# Patient Record
Sex: Female | Born: 1965 | Race: White | Hispanic: No | Marital: Married | Smoking: Current every day smoker
Health system: Southern US, Community
[De-identification: ages and names within clinical notes are randomized; demographics above are authoritative.]

## PROBLEM LIST (undated history)

## (undated) DIAGNOSIS — S82142A Displaced bicondylar fracture of left tibia, initial encounter for closed fracture: Secondary | ICD-10-CM

## (undated) DIAGNOSIS — R519 Headache, unspecified: Secondary | ICD-10-CM

## (undated) DIAGNOSIS — F17200 Nicotine dependence, unspecified, uncomplicated: Secondary | ICD-10-CM

## (undated) DIAGNOSIS — K219 Gastro-esophageal reflux disease without esophagitis: Secondary | ICD-10-CM

## (undated) DIAGNOSIS — F419 Anxiety disorder, unspecified: Secondary | ICD-10-CM

## (undated) DIAGNOSIS — R51 Headache: Secondary | ICD-10-CM

## (undated) HISTORY — PX: JOINT REPLACEMENT: SHX530

## (undated) HISTORY — PX: NO PAST SURGERIES: SHX2092

---

## 2009-03-29 ENCOUNTER — Encounter: Admission: RE | Admit: 2009-03-29 | Discharge: 2009-03-29 | Payer: Self-pay | Admitting: Unknown Physician Specialty

## 2009-10-16 ENCOUNTER — Encounter: Admission: RE | Admit: 2009-10-16 | Discharge: 2009-10-16 | Payer: Self-pay | Admitting: Unknown Physician Specialty

## 2011-10-21 ENCOUNTER — Encounter (HOSPITAL_BASED_OUTPATIENT_CLINIC_OR_DEPARTMENT_OTHER): Payer: Self-pay

## 2011-10-21 ENCOUNTER — Ambulatory Visit (HOSPITAL_BASED_OUTPATIENT_CLINIC_OR_DEPARTMENT_OTHER)
Admission: RE | Admit: 2011-10-21 | Discharge: 2011-10-21 | Disposition: A | Discharge status: Home / Self Care | Payer: BC Managed Care – PPO | Source: Ambulatory Visit | Attending: Unknown Physician Specialty | Admitting: Unknown Physician Specialty

## 2011-10-21 ENCOUNTER — Other Ambulatory Visit (HOSPITAL_BASED_OUTPATIENT_CLINIC_OR_DEPARTMENT_OTHER): Payer: Self-pay | Admitting: Unknown Physician Specialty

## 2011-10-21 ENCOUNTER — Ambulatory Visit (INDEPENDENT_AMBULATORY_CARE_PROVIDER_SITE_OTHER): Payer: BC Managed Care – PPO

## 2011-10-21 ENCOUNTER — Other Ambulatory Visit: Payer: Self-pay | Admitting: Unknown Physician Specialty

## 2011-10-21 DIAGNOSIS — R109 Unspecified abdominal pain: Secondary | ICD-10-CM

## 2011-10-21 DIAGNOSIS — K5730 Diverticulosis of large intestine without perforation or abscess without bleeding: Secondary | ICD-10-CM | POA: Insufficient documentation

## 2011-10-21 MED ORDER — IOHEXOL 300 MG/ML  SOLN
100.0000 mL | Freq: Once | INTRAMUSCULAR | Status: AC | PRN
  Administered 2011-10-21: 100 mL via INTRAVENOUS

## 2013-09-17 ENCOUNTER — Ambulatory Visit (INDEPENDENT_AMBULATORY_CARE_PROVIDER_SITE_OTHER): Payer: BC Managed Care – PPO

## 2013-09-17 ENCOUNTER — Other Ambulatory Visit: Payer: Self-pay | Admitting: Family Medicine

## 2013-09-17 DIAGNOSIS — R1084 Generalized abdominal pain: Secondary | ICD-10-CM

## 2014-04-22 ENCOUNTER — Encounter (HOSPITAL_BASED_OUTPATIENT_CLINIC_OR_DEPARTMENT_OTHER): Payer: Self-pay | Admitting: Emergency Medicine

## 2014-04-22 ENCOUNTER — Emergency Department (HOSPITAL_BASED_OUTPATIENT_CLINIC_OR_DEPARTMENT_OTHER): Payer: BC Managed Care – PPO

## 2014-04-22 ENCOUNTER — Emergency Department (HOSPITAL_BASED_OUTPATIENT_CLINIC_OR_DEPARTMENT_OTHER)
Admission: EM | Admit: 2014-04-22 | Discharge: 2014-04-22 | Disposition: A | Discharge status: Home / Self Care | Payer: BC Managed Care – PPO | Attending: Emergency Medicine | Admitting: Emergency Medicine

## 2014-04-22 DIAGNOSIS — Z720 Tobacco use: Secondary | ICD-10-CM | POA: Diagnosis not present

## 2014-04-22 DIAGNOSIS — S20212A Contusion of left front wall of thorax, initial encounter: Secondary | ICD-10-CM | POA: Insufficient documentation

## 2014-04-22 DIAGNOSIS — W01198A Fall on same level from slipping, tripping and stumbling with subsequent striking against other object, initial encounter: Secondary | ICD-10-CM | POA: Insufficient documentation

## 2014-04-22 DIAGNOSIS — Y9289 Other specified places as the place of occurrence of the external cause: Secondary | ICD-10-CM | POA: Insufficient documentation

## 2014-04-22 DIAGNOSIS — Y998 Other external cause status: Secondary | ICD-10-CM | POA: Diagnosis not present

## 2014-04-22 DIAGNOSIS — S299XXA Unspecified injury of thorax, initial encounter: Secondary | ICD-10-CM | POA: Diagnosis present

## 2014-04-22 DIAGNOSIS — Y9389 Activity, other specified: Secondary | ICD-10-CM | POA: Diagnosis not present

## 2014-04-22 LAB — URINE MICROSCOPIC-ADD ON

## 2014-04-22 LAB — URINALYSIS, ROUTINE W REFLEX MICROSCOPIC
BILIRUBIN URINE: NEGATIVE
Glucose, UA: NEGATIVE mg/dL
Ketones, ur: NEGATIVE mg/dL
LEUKOCYTES UA: NEGATIVE
NITRITE: NEGATIVE
PH: 5 (ref 5.0–8.0)
Protein, ur: NEGATIVE mg/dL
SPECIFIC GRAVITY, URINE: 1.005 (ref 1.005–1.030)
Urobilinogen, UA: 0.2 mg/dL (ref 0.0–1.0)

## 2014-04-22 MED ORDER — IBUPROFEN 600 MG PO TABS: 600.0000 mg | ORAL_TABLET | Freq: Four times a day (QID) | ORAL | Status: DC | PRN

## 2014-04-22 MED ORDER — OXYCODONE-ACETAMINOPHEN 5-325 MG PO TABS
1.0000 | ORAL_TABLET | Freq: Once | ORAL | Status: DC
  Filled 2014-04-22: qty 1

## 2014-04-22 MED ORDER — OXYCODONE-ACETAMINOPHEN 5-325 MG PO TABS: 1.0000 | ORAL_TABLET | Freq: Four times a day (QID) | ORAL | Status: DC | PRN

## 2014-04-22 MED ORDER — KETOROLAC TROMETHAMINE 60 MG/2ML IM SOLN
60.0000 mg | Freq: Once | INTRAMUSCULAR | Status: AC
  Administered 2014-04-22: 60 mg via INTRAMUSCULAR
  Filled 2014-04-22: qty 2

## 2014-04-22 NOTE — ED Notes (Signed)
Fall hitting left side rib area on metal bar stool. Bruising noted.

## 2014-04-22 NOTE — Discharge Instructions (Signed)

## 2014-04-22 NOTE — ED Provider Notes (Signed)
CSN: [id number]     Arrival date & time 04/22/14  0745 History   First MD Initiated Contact with Patient 04/22/14 [phone]     Chief Complaint  Patient presents with  . Rib Injury     (Consider location/radiation/quality/duration/timing/severity/associated sxs/prior Treatment) HPI  This a 49 year old female who presents following a fall. Patient reports left-sided chest pain following a fall on Wednesday. She states that she tripped and fell over her cat and fell onto a bar stool at home. She's had progressive pain. Current pain is 10 out of 10. It is not relieved with Motrin at home. It is worse with breathing and coughing. Patient denies any abdominal pain, nausea, vomiting, diarrhea, hematuria. Denies any other injury. Denies hitting her head or loss of consciousness. Patient is not on any anticoagulants.  History reviewed. No pertinent past medical history. History reviewed. No pertinent past surgical history. No family history on file. History  Substance Use Topics  . Smoking status: Current Every Day Smoker -- 0.50 packs/day    Types: Cigarettes  . Smokeless tobacco: Not on file  . Alcohol Use: No   OB History    No data available     Review of Systems  Constitutional: Negative for fever.  Respiratory: Negative for cough, chest tightness and shortness of breath.   Cardiovascular: Positive for chest pain.  Gastrointestinal: Negative for nausea, vomiting and abdominal pain.  Genitourinary: Negative for hematuria.  Musculoskeletal: Negative for back pain.  Skin: Positive for wound.  Neurological: Negative for syncope and headaches.  Psychiatric/Behavioral: Negative for confusion.  All other systems reviewed and are negative.     Allergies  Review of patient's allergies indicates no known allergies.  Home Medications   Prior to Admission medications   Medication Sig Start Date End Date Taking? Authorizing Provider  ibuprofen (ADVIL,MOTRIN) 800 MG tablet Take 800 mg by  mouth every 8 (eight) hours as needed.   Yes Historical Provider, MD  ibuprofen (ADVIL,MOTRIN) 600 MG tablet Take 1 tablet (600 mg total) by mouth every 6 (six) hours as needed. 04/22/14   Shon Batonourtney F Mohamedamin Nifong, MD  oxyCODONE-acetaminophen (PERCOCET/ROXICET) 5-325 MG per tablet Take 1-2 tablets by mouth every 6 (six) hours as needed for severe pain. 04/22/14   Shon Batonourtney F Bethene Hankinson, MD   BP 128/55 mmHg  Pulse 89  Temp(Src) 98.4 F (36.9 C) (Oral)  Resp 16  Ht 5' 8.5" (1.74 m)  Wt 155 lb (70.308 kg)  BMI 23.22 kg/m2  SpO2 97% Physical Exam  Constitutional: She is oriented to person, place, and time. She appears well-developed and well-nourished. No distress.  HENT:  Head: Normocephalic and atraumatic.  Eyes: Pupils are equal, round, and reactive to light.  Cardiovascular: Normal rate, regular rhythm and normal heart sounds.   No murmur heard. Pulmonary/Chest: Effort normal and breath sounds normal. No respiratory distress. She has no wheezes. She exhibits tenderness.  Tenderness to palpation over the left lateral chest wall, no crepitus noted, bruising noted over the left flank  Abdominal: Soft. Bowel sounds are normal. There is no tenderness. There is no rebound.  Neurological: She is alert and oriented to person, place, and time.  Skin: Skin is warm and dry.  Bruising of her left flank  Psychiatric: She has a normal mood and affect.  Nursing note and vitals reviewed.   ED Course  Procedures (including critical care time) Labs Review Labs Reviewed  URINALYSIS, ROUTINE W REFLEX MICROSCOPIC - Abnormal; Notable for the following:    Hgb urine dipstick  TRACE (*)    All other components within normal limits  URINE MICROSCOPIC-ADD ON - Abnormal; Notable for the following:    Squamous Epithelial / LPF FEW (*)    Bacteria, UA FEW (*)    All other components within normal limits    Imaging Review Dg Chest 2 View  04/22/2014   CLINICAL DATA:  Status post fall 2 days ago with a blow to the left  side of the chest. Left rib pain.  EXAM: CHEST  2 VIEW  COMPARISON:  None.  FINDINGS: The lungs are clear. The chest is hyperexpanded with attenuation of the pulmonary vasculature. Heart size is normal. No pneumothorax or pleural effusion.  IMPRESSION: No acute disease.  Findings compatible with emphysema.   Electronically Signed   By: Drusilla Kanner M.D.   On: 04/22/2014 08:52     EKG Interpretation None      MDM   Final diagnoses:  Rib contusion, left, initial encounter    Patient presents with injury to the left rib cage, no crepitus. Vital signs stable. No evidence of refracture on exam. Patient is a smoker. Advised cessation. Patient will be given pain control for likely rib contusion versus occult rib fracture. Discussed with patient the diagnosis and supportive care at home. Patient stated understanding.  After history, exam, and medical workup I feel the patient has been appropriately medically screened and is safe for discharge home. Pertinent diagnoses were discussed with the patient. Patient was given return precautions.   Shon Baton, MD 04/22/14 1352

## 2017-05-01 ENCOUNTER — Other Ambulatory Visit: Payer: Self-pay

## 2017-05-01 ENCOUNTER — Encounter (HOSPITAL_BASED_OUTPATIENT_CLINIC_OR_DEPARTMENT_OTHER): Payer: Self-pay | Admitting: *Deleted

## 2017-05-01 ENCOUNTER — Inpatient Hospital Stay (HOSPITAL_BASED_OUTPATIENT_CLINIC_OR_DEPARTMENT_OTHER)
Admission: EM | Admit: 2017-05-01 | Discharge: 2017-05-07 | DRG: 488 | Disposition: A | Discharge status: Home Health | Payer: Worker's Compensation | Attending: Student | Admitting: Student

## 2017-05-01 ENCOUNTER — Emergency Department (HOSPITAL_BASED_OUTPATIENT_CLINIC_OR_DEPARTMENT_OTHER): Payer: Worker's Compensation

## 2017-05-01 DIAGNOSIS — K219 Gastro-esophageal reflux disease without esophagitis: Secondary | ICD-10-CM | POA: Diagnosis present

## 2017-05-01 DIAGNOSIS — S82142A Displaced bicondylar fracture of left tibia, initial encounter for closed fracture: Secondary | ICD-10-CM | POA: Diagnosis present

## 2017-05-01 DIAGNOSIS — Y92212 Middle school as the place of occurrence of the external cause: Secondary | ICD-10-CM | POA: Diagnosis not present

## 2017-05-01 DIAGNOSIS — T148XXA Other injury of unspecified body region, initial encounter: Secondary | ICD-10-CM

## 2017-05-01 DIAGNOSIS — S83282A Other tear of lateral meniscus, current injury, left knee, initial encounter: Secondary | ICD-10-CM | POA: Diagnosis present

## 2017-05-01 DIAGNOSIS — W208XXA Other cause of strike by thrown, projected or falling object, initial encounter: Secondary | ICD-10-CM | POA: Diagnosis not present

## 2017-05-01 DIAGNOSIS — D62 Acute posthemorrhagic anemia: Secondary | ICD-10-CM | POA: Diagnosis not present

## 2017-05-01 DIAGNOSIS — F17200 Nicotine dependence, unspecified, uncomplicated: Secondary | ICD-10-CM | POA: Diagnosis present

## 2017-05-01 DIAGNOSIS — Y990 Civilian activity done for income or pay: Secondary | ICD-10-CM | POA: Diagnosis not present

## 2017-05-01 DIAGNOSIS — F17210 Nicotine dependence, cigarettes, uncomplicated: Secondary | ICD-10-CM | POA: Diagnosis present

## 2017-05-01 DIAGNOSIS — M79662 Pain in left lower leg: Secondary | ICD-10-CM | POA: Diagnosis present

## 2017-05-01 DIAGNOSIS — S82122A Displaced fracture of lateral condyle of left tibia, initial encounter for closed fracture: Secondary | ICD-10-CM | POA: Diagnosis present

## 2017-05-01 DIAGNOSIS — Z01811 Encounter for preprocedural respiratory examination: Secondary | ICD-10-CM

## 2017-05-01 DIAGNOSIS — F419 Anxiety disorder, unspecified: Secondary | ICD-10-CM | POA: Diagnosis present

## 2017-05-01 HISTORY — DX: Gastro-esophageal reflux disease without esophagitis: K21.9

## 2017-05-01 HISTORY — DX: Displaced bicondylar fracture of left tibia, initial encounter for closed fracture: S82.142A

## 2017-05-01 HISTORY — DX: Nicotine dependence, unspecified, uncomplicated: F17.200

## 2017-05-01 HISTORY — DX: Anxiety disorder, unspecified: F41.9

## 2017-05-01 LAB — CBC
HEMATOCRIT: 36.5 % (ref 36.0–46.0)
HEMOGLOBIN: 12.2 g/dL (ref 12.0–15.0)
MCH: 30.9 pg (ref 26.0–34.0)
MCHC: 33.4 g/dL (ref 30.0–36.0)
MCV: 92.4 fL (ref 78.0–100.0)
Platelets: 278 10*3/uL (ref 150–400)
RBC: 3.95 MIL/uL (ref 3.87–5.11)
RDW: 12.7 % (ref 11.5–15.5)
WBC: 10.7 10*3/uL — ABNORMAL HIGH (ref 4.0–10.5)

## 2017-05-01 LAB — CREATININE, SERUM
CREATININE: 0.77 mg/dL (ref 0.44–1.00)
GFR calc Af Amer: >60 mL/min (ref >60)

## 2017-05-01 LAB — BASIC METABOLIC PANEL
Anion gap: 12 (ref 5–15)
BUN: 19 mg/dL (ref 6–20)
CHLORIDE: 101 mmol/L (ref 101–111)
CO2: 21 mmol/L — ABNORMAL LOW (ref 22–32)
CREATININE: 0.69 mg/dL (ref 0.44–1.00)
Calcium: 9.2 mg/dL (ref 8.9–10.3)
GFR calc Af Amer: >60 mL/min (ref >60)
GFR calc non Af Amer: >60 mL/min (ref >60)
Glucose, Bld: 117 mg/dL — ABNORMAL HIGH (ref 65–99)
Potassium: 3.9 mmol/L (ref 3.5–5.1)
SODIUM: 134 mmol/L — AB (ref 135–145)

## 2017-05-01 LAB — CBC WITH DIFFERENTIAL/PLATELET
Basophils Absolute: 0 10*3/uL (ref 0.0–0.1)
Basophils Relative: 0 %
EOS ABS: 0 10*3/uL (ref 0.0–0.7)
EOS PCT: 0 %
HCT: 36.4 % (ref 36.0–46.0)
HEMOGLOBIN: 12.7 g/dL (ref 12.0–15.0)
LYMPHS ABS: 1 10*3/uL (ref 0.7–4.0)
Lymphocytes Relative: 9 %
MCH: 31.6 pg (ref 26.0–34.0)
MCHC: 34.9 g/dL (ref 30.0–36.0)
MCV: 90.5 fL (ref 78.0–100.0)
MONO ABS: 0.5 10*3/uL (ref 0.1–1.0)
MONOS PCT: 4 %
NEUTROS PCT: 87 %
Neutro Abs: 9.8 10*3/uL — ABNORMAL HIGH (ref 1.7–7.7)
Platelets: 307 10*3/uL (ref 150–400)
RBC: 4.02 MIL/uL (ref 3.87–5.11)
RDW: 12.1 % (ref 11.5–15.5)
WBC: 11.4 10*3/uL — ABNORMAL HIGH (ref 4.0–10.5)

## 2017-05-01 MED ORDER — OXYCODONE HCL 5 MG PO TABS
5.0000 mg | ORAL_TABLET | Freq: Once | ORAL | Status: AC
  Administered 2017-05-01: 5 mg via ORAL
  Filled 2017-05-01: qty 1

## 2017-05-01 MED ORDER — ACETAMINOPHEN 500 MG PO TABS
1000.0000 mg | ORAL_TABLET | Freq: Once | ORAL | Status: AC
  Administered 2017-05-01: 1000 mg via ORAL
  Filled 2017-05-01: qty 2

## 2017-05-01 MED ORDER — HYDROMORPHONE HCL 1 MG/ML IJ SOLN
1.0000 mg | Freq: Once | INTRAMUSCULAR | Status: AC
  Administered 2017-05-01: 1 mg via INTRAVENOUS
  Filled 2017-05-01: qty 1

## 2017-05-01 MED ORDER — KETAMINE HCL 10 MG/ML IJ SOLN
10.0000 mg | Freq: Once | INTRAMUSCULAR | Status: AC
  Administered 2017-05-01: 10 mg via INTRAVENOUS
  Filled 2017-05-01: qty 1

## 2017-05-01 MED ORDER — DIAZEPAM 5 MG PO TABS
5.0000 mg | ORAL_TABLET | Freq: Once | ORAL | Status: AC
  Administered 2017-05-01: 5 mg via ORAL
  Filled 2017-05-01: qty 1

## 2017-05-01 MED ORDER — ONDANSETRON HCL 4 MG/2ML IJ SOLN
4.0000 mg | Freq: Once | INTRAMUSCULAR | Status: AC
  Administered 2017-05-01: 4 mg via INTRAVENOUS
  Filled 2017-05-01: qty 2

## 2017-05-01 MED ORDER — ENOXAPARIN SODIUM 40 MG/0.4ML ~~LOC~~ SOLN
40.0000 mg | SUBCUTANEOUS | Status: DC
  Administered 2017-05-02 – 2017-05-03 (×3): 40 mg via SUBCUTANEOUS
  Filled 2017-05-01 (×3): qty 0.4

## 2017-05-01 MED ORDER — SODIUM CHLORIDE 0.9 % IV SOLN
INTRAVENOUS | Status: DC
  Administered 2017-05-02 – 2017-05-03 (×3): via INTRAVENOUS

## 2017-05-01 MED ORDER — OXYCODONE HCL 5 MG PO TABS
5.0000 mg | ORAL_TABLET | ORAL | Status: DC | PRN
Start: 2017-05-01 — End: 2017-05-02
  Administered 2017-05-02: 10 mg via ORAL
  Filled 2017-05-01: qty 2

## 2017-05-01 MED ORDER — MORPHINE SULFATE (PF) 2 MG/ML IV SOLN
1.0000 mg | INTRAVENOUS | Status: DC | PRN
Start: 2017-05-01 — End: 2017-05-07
  Administered 2017-05-02 – 2017-05-07 (×15): 2 mg via INTRAVENOUS
  Filled 2017-05-01 (×16): qty 1

## 2017-05-01 MED ORDER — KETOROLAC TROMETHAMINE 15 MG/ML IJ SOLN
15.0000 mg | Freq: Once | INTRAMUSCULAR | Status: AC
Start: 2017-05-01 — End: 2017-05-01
  Administered 2017-05-01: 15 mg via INTRAMUSCULAR
  Filled 2017-05-01: qty 1

## 2017-05-01 MED ORDER — ACETAMINOPHEN 325 MG PO TABS
650.0000 mg | ORAL_TABLET | Freq: Four times a day (QID) | ORAL | Status: DC | PRN
  Administered 2017-05-01: 650 mg via ORAL
  Filled 2017-05-01: qty 2

## 2017-05-01 MED ORDER — SERTRALINE HCL 100 MG PO TABS
100.0000 mg | ORAL_TABLET | Freq: Every day | ORAL | Status: DC
  Administered 2017-05-02 – 2017-05-07 (×6): 100 mg via ORAL
  Filled 2017-05-01 (×6): qty 1

## 2017-05-01 MED ORDER — METHOCARBAMOL 1000 MG/10ML IJ SOLN
500.0000 mg | Freq: Four times a day (QID) | INTRAVENOUS | Status: DC | PRN
  Administered 2017-05-02: 500 mg via INTRAVENOUS
  Filled 2017-05-01 (×2): qty 5

## 2017-05-01 MED ORDER — ACETAMINOPHEN 650 MG RE SUPP: 650.0000 mg | Freq: Four times a day (QID) | RECTAL | Status: DC | PRN

## 2017-05-01 MED ORDER — METHOCARBAMOL 500 MG PO TABS
500.0000 mg | ORAL_TABLET | Freq: Four times a day (QID) | ORAL | Status: DC | PRN
  Administered 2017-05-02: 500 mg via ORAL
  Filled 2017-05-01: qty 1

## 2017-05-01 NOTE — H&P (Addendum)
ORTHOPAEDIC H&P  REQUESTING PHYSICIAN: Samson FredericSwinteck, Eydie Wormley, MD  PCP:  Patient, No Pcp Per  Chief Complaint: L knee pain  HPI: Amber Haley is a 52 y.o. female who complains of  L knee pain after a pallet of frozen juice fell on her left leg. 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. Denies other injuries. Workup revealed a comminuted displaced left lateral tibial plateau fracture. She is currently quitting smoking and is down to 5 cigarettes per day. Denies numbness/tingling.  Past Medical History:  Diagnosis Date  . Anxiety    History reviewed. No pertinent surgical history. Social History   Socioeconomic History  . Marital status: Married    Spouse name: Not on file  . Number of children: Not on file  . Years of education: Not on file  . Highest education level: Not on file  Occupational History  . Not on file  Social Needs  . Financial resource strain: Not on file  . Food insecurity:    Worry: Not on file    Inability: Not on file  . Transportation needs:    Medical: Not on file    Non-medical: Not on file  Tobacco Use  . Smoking status: Current Every Day Smoker    Packs/day: 0.50    Types: Cigarettes  . Smokeless tobacco: Never Used  Substance and Sexual Activity  . Alcohol use: No  . Drug use: Not on file  . Sexual activity: Not on file  Lifestyle  . Physical activity:    Days per week: Not on file    Minutes per session: Not on file  . Stress: Not on file  Relationships  . Social connections:    Talks on phone: Not on file    Gets together: Not on file    Attends religious service: Not on file    Active member of club or organization: Not on file    Attends meetings of clubs or organizations: Not on file    Relationship status: Not on file  Other Topics Concern  . Not on file  Social History Narrative  . Not on file   No family history on file. No Known Allergies Prior to Admission medications   Medication Sig Start Date End Date Taking?  Authorizing Provider  ibuprofen (ADVIL,MOTRIN) 600 MG tablet Take 1 tablet (600 mg total) by mouth every 6 (six) hours as needed. 04/22/14   Horton, Mayer Maskerourtney F, MD  ibuprofen (ADVIL,MOTRIN) 800 MG tablet Take 800 mg by mouth every 8 (eight) hours as needed.    [provider]  oxyCODONE-acetaminophen (PERCOCET/ROXICET) 5-325 MG per tablet Take 1-2 tablets by mouth every 6 (six) hours as needed for severe pain. 04/22/14   Horton, Mayer Maskerourtney F, MD  Sertraline HCl (ZOLOFT PO) Take by mouth.    [provider]   Dg Tibia/fibula Left  Result Date: 05/01/2017 CLINICAL DATA:  Left lower leg pain after a case of juices fell on her leg at work. EXAM: LEFT TIBIA AND FIBULA - 2 VIEW COMPARISON:  None. FINDINGS: Comminuted, depressed intra-articular fracture of the lateral tibial plateau. A longitudinal nondisplaced component extends inferiorly into the proximal tibial diaphysis. The ankle is grossly unremarkable. Bone mineralization is normal. Small phleboliths in the anterior lower leg IMPRESSION: 1. Comminuted, depressed intra-articular fracture of the lateral tibial plateau. Electronically Signed   By: Obie DredgeWilliam T Derry M.D.   On: 05/01/2017 14:46   Ct Knee Left Wo Contrast  Result Date: 05/01/2017 CLINICAL DATA:  Left knee pain.  Unable to straighten the knee. EXAM: CT OF THE LEFT KNEE WITHOUT CONTRAST TECHNIQUE: Multidetector CT imaging of the LEFT knee was performed according to the standard protocol. Multiplanar CT image reconstructions were also generated. COMPARISON:  None. FINDINGS: Bones/Joint/Cartilage Severely comminuted fracture of the lateral tibial plateau with 9 mm of depression involving an articular surface area of 30 x 20 mm. Subtle nondisplaced fracture cleft extends along the lateral margin of the tibia into the proximal tibial diaphysis. No other fracture or dislocation.  Small joint effusion. No aggressive osseous lesion. Ligaments Suboptimally assessed by CT. Muscles and Tendons  Muscles are normal. No muscle atrophy. Quadriceps and patellar tendon are intact. Soft tissues No focal fluid collection or hematoma. Soft tissue contusion along the anterior aspect of the proximal tibia. IMPRESSION: 1. Severely comminuted fracture of the lateral tibial plateau with 9 mm of depression involving an articular surface area of 30 x 20 mm. Subtle nondisplaced fracture cleft extends along the lateral margin of the tibia into the proximal tibial diaphysis. Electronically Signed   By: Elige Ko   On: 05/01/2017 16:12    Positive ROS: All other systems have been reviewed and were otherwise negative with the exception of those mentioned in the HPI and as above.  Physical Exam: General: Alert, no acute distress Cardiovascular: No pedal edema Respiratory: No cyanosis, no use of accessory musculature GI: No organomegaly, abdomen is soft and non-tender Skin: No lesions in the area of chief complaint Neurologic: Sensation intact distally Psychiatric: Patient is competent for consent with normal mood and affect Lymphatic: No axillary or cervical lymphadenopathy  MUSCULOSKELETAL: examination of the left lower extremity reveals no skin wounds. She has swelling and ecchymosis to the proximal lateral tibia. Compartments are soft and compressible. (+) TA/GS/EHL. SILT S/S/SP/DP/PT. 2+ DP.  Assessment: Comminuted L lateral split/depression tibial plateau fx with significant joint depression Tobacco abuse  Plan: Admit to hospital for compartment checks and aggressive elevation / ice. NWB LLE. Apply bulky jones dressing and knee immobilizer. Due to the complexity of this fracture, I have discussed her with the orthopaedic trauma service who will take over on Monday. OK for diet; NPO after MN on Sunday night for possible surgery on Monday.    Jonette Pesa, MD Cell [phone]    05/01/2017 6:12 PM ons

## 2017-05-01 NOTE — ED Notes (Signed)
Paged Orthopedic Surgeon @ [phone] (Dr. Linna CapriceSwinteck) @ 2:54 pm

## 2017-05-01 NOTE — Progress Notes (Signed)
Orthopedic Tech Progress Note Patient Details:  Wilmer FloorLisa A Tagliaferri 07/20/1965 [id number]  Ortho Devices Type of Ortho Device: Ace wrap, Lenora BoysWatson Jones splint, Knee Immobilizer Ortho Device/Splint Location: LLE Ortho Device/Splint Interventions: Ordered, Application   Post Interventions Patient Tolerated: Well Instructions Provided: Care of device   Jennye MoccasinHughes, Altagracia Rone Craig 05/01/2017, 6:16 PM

## 2017-05-01 NOTE — ED Notes (Signed)
[facility] arrived report given to AvnetPaul RN.

## 2017-05-01 NOTE — ED Notes (Signed)
Ortho tech at bedside 

## 2017-05-01 NOTE — ED Provider Notes (Signed)
x [facility] Provider Note   CSN: [id number] Arrival date & time: 05/01/17  1325     History   Chief Complaint Chief Complaint  Patient presents with  . Leg Injury    HPI Edit Amber Haley is a 52 y.o. female.  52 yo F with a chief complaint of left knee pain.  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  She was not pinned underneath.  Had pain and was unable to straighten her leg afterwards and so came to the ED.  She denies other injury.  The history is provided by the patient.  Injury  This is a new problem. The current episode started less than 1 hour ago. The problem occurs constantly. The problem has not changed since onset.Pertinent negatives include no chest pain, no headaches and no shortness of breath. The symptoms are aggravated by bending and twisting. Nothing relieves the symptoms. She has tried nothing for the symptoms. The treatment provided no relief.    Past Medical History:  Diagnosis Date  . Anxiety   . GERD (gastroesophageal reflux disease)   . Tibial plateau fracture, left 05/01/2017   severely comminuted left tibial plateau fracture occurring today when a stack of cases of orange juice fell onto her leg./notes 05/02/2017    Patient Active Problem List   Diagnosis Date Noted  . Closed fracture of lateral portion of left tibial plateau 05/01/2017    Past Surgical History:  Procedure Laterality Date  . NO PAST SURGERIES       OB History   None      Home Medications    Prior to Admission medications   Medication Sig Start Date End Date Taking? Authorizing Provider  ALPRAZolam Prudy Feeler) 0.25 MG tablet Take 0.25 mg by mouth daily as needed for anxiety.  04/15/16  Yes [provider]  ibuprofen (ADVIL,MOTRIN) 600 MG tablet Take 1 tablet (600 mg total) by mouth every 6 (six) hours as needed. Patient taking differently: Take 600 mg by mouth every 6 (six) hours as  needed (for pain).  04/22/14  Yes Horton, Mayer Masker, MD  Multiple Vitamins-Calcium (ONE-A-DAY WOMENS PO) Take 1 tablet by mouth daily.   Yes [provider]  omeprazole (PRILOSEC OTC) 20 MG tablet Take 20 mg by mouth daily.   Yes [provider]  Probiotic Product (ALIGN) 4 MG CAPS Take 4 mg by mouth daily.   Yes [provider]  sertraline (ZOLOFT) 100 MG tablet Take 100 mg by mouth daily.   Yes [provider]  traZODone (DESYREL) 50 MG tablet Take 50 mg by mouth at bedtime as needed for sleep.  11/02/15  Yes [provider]  oxyCODONE-acetaminophen (PERCOCET/ROXICET) 5-325 MG per tablet Take 1-2 tablets by mouth every 6 (six) hours as needed for severe pain. 04/22/14   Horton, Mayer Masker, MD    Family History History reviewed. No pertinent family history.  Social History Social History   Tobacco Use  . Smoking status: Current Every Day Smoker    Packs/day: 0.12    Years: 39.00    Pack years: 4.68    Types: Cigarettes  . Smokeless tobacco: Never Used  Substance Use Topics  . Alcohol use: Yes    Alcohol/week: 6.0 oz    Types: 10 Cans of beer per week  . Drug use: Not Currently     Allergies   Nsaids   Review of  Systems Review of Systems  Constitutional: Negative for chills and fever.  HENT: Negative for congestion and rhinorrhea.   Eyes: Negative for redness and visual disturbance.  Respiratory: Negative for shortness of breath and wheezing.   Cardiovascular: Negative for chest pain and palpitations.  Gastrointestinal: Negative for nausea and vomiting.  Genitourinary: Negative for dysuria and urgency.  Musculoskeletal: Positive for arthralgias. Negative for myalgias.  Skin: Negative for pallor and wound.  Neurological: Negative for dizziness and headaches.     Physical Exam Updated Vital Signs BP 117/67 (BP Location: Left Arm)   Pulse 72   Temp (!) 97.4 F (36.3 C) (Oral)   Resp 16   Ht 5\' 8"  (1.727 m)   Wt 71.7 kg  (158 lb)   SpO2 98%   BMI 24.02 kg/m   Physical Exam  Constitutional: She is oriented to person, place, and time. She appears well-developed and well-nourished. No distress.  HENT:  Head: Normocephalic and atraumatic.  Eyes: Pupils are equal, round, and reactive to light. EOM are normal.  Neck: Normal range of motion. Neck supple.  Cardiovascular: Normal rate and regular rhythm. Exam reveals no gallop and no friction rub.  No murmur heard. Pulmonary/Chest: Effort normal. She has no wheezes. She has no rales.  Abdominal: Soft. She exhibits no distension. There is no tenderness.  Musculoskeletal: She exhibits edema and tenderness.  PMS intact distally to the LLE, pain and swelling focally to the knee just distally to the joint with significant edema.  Compartments are soft, unable to extend the leg at the knee.   Neurological: She is alert and oriented to person, place, and time.  Skin: Skin is warm and dry. She is not diaphoretic.  Psychiatric: She has a normal mood and affect. Her behavior is normal.  Nursing note and vitals reviewed.    ED Treatments / Results  Labs (all labs ordered are listed, but only abnormal results are displayed) Labs Reviewed  CBC WITH DIFFERENTIAL/PLATELET - Abnormal; Notable for the following components:      Result Value   WBC 11.4 (*)    Neutro Abs 9.8 (*)    All other components within normal limits  BASIC METABOLIC PANEL - Abnormal; Notable for the following components:   Sodium 134 (*)    CO2 21 (*)    Glucose, Bld 117 (*)    All other components within normal limits  CBC - Abnormal; Notable for the following components:   WBC 10.7 (*)    All other components within normal limits  CBC - Abnormal; Notable for the following components:   RBC 3.43 (*)    Hemoglobin 10.4 (*)    HCT 32.4 (*)    All other components within normal limits  COMPREHENSIVE METABOLIC PANEL - Abnormal; Notable for the following components:   Glucose, Bld 100 (*)     Calcium 8.7 (*)    Total Protein 5.9 (*)    Albumin 3.4 (*)    All other components within normal limits  SURGICAL PCR SCREEN  HIV ANTIBODY (ROUTINE TESTING)  CREATININE, SERUM  TSH  MAGNESIUM  PHOSPHORUS  PREALBUMIN  HEMOGLOBIN A1C  VITAMIN D 25 HYDROXY (VIT D DEFICIENCY, FRACTURES)  CALCITRIOL (1,25 DI-OH VIT D)  PTH, INTACT AND CALCIUM  NICOTINE/COTININE METABOLITES    EKG None  Radiology Dg Chest Port 1 View  Result Date: 05/03/2017 CLINICAL DATA:  Preop for ORIF of left tibial plateau fracture. Smoking history. No current chest complaints. EXAM: PORTABLE CHEST 1 VIEW COMPARISON:  04/22/2014 FINDINGS: The cardiomediastinal silhouette is within normal limits. The lungs are well inflated and clear. There is no evidence of pleural effusion or pneumothorax. No acute osseous abnormality is identified. IMPRESSION: No active disease. Electronically Signed   By: Sebastian Ache M.D.   On: 05/03/2017 09:16    Procedures Procedures (including critical care time)  Medications Ordered in ED Medications  sertraline (ZOLOFT) tablet 100 mg (100 mg Oral Given 05/04/17 0849)  morphine 2 MG/ML injection 1-2 mg (2 mg Intravenous Given 05/03/17 1205)  acetaminophen (TYLENOL) tablet 650 mg (650 mg Oral Given 05/04/17 1209)    Or  acetaminophen (TYLENOL) suppository 650 mg ( Rectal See Alternative 05/04/17 1209)  ketorolac (TORADOL) 15 MG/ML injection 15 mg (15 mg Intravenous Given 05/04/17 1339)  acetaminophen (TYLENOL) tablet 650 mg (has no administration in time range)    Or  acetaminophen (TYLENOL) suppository 650 mg (has no administration in time range)  oxyCODONE (Oxy IR/ROXICODONE) immediate release tablet 5-15 mg (15 mg Oral Given 05/04/17 1338)  diphenhydrAMINE (BENADRYL) 12.5 MG/5ML elixir 12.5-25 mg (has no administration in time range)  docusate sodium (COLACE) capsule 100 mg (100 mg Oral Given 05/04/17 0848)  polyethylene glycol (MIRALAX / GLYCOLAX) packet 17 g (17 g Oral Not Given  05/04/17 0852)  pantoprazole (PROTONIX) EC tablet 40 mg (40 mg Oral Given 05/04/17 0850)  methocarbamol (ROBAXIN) tablet 750 mg (750 mg Oral Given 05/04/17 1338)  acetaminophen (TYLENOL) tablet 1,000 mg (1,000 mg Oral Given 05/01/17 1446)  oxyCODONE (Oxy IR/ROXICODONE) immediate release tablet 5 mg (5 mg Oral Given 05/01/17 1446)  diazepam (VALIUM) tablet 5 mg (5 mg Oral Given 05/01/17 1446)  ketorolac (TORADOL) 15 MG/ML injection 15 mg (15 mg Intramuscular Given 05/01/17 1446)  ketamine (KETALAR) injection 10 mg (10 mg Intravenous Given 05/01/17 1548)  HYDROmorphone (DILAUDID) injection 1 mg (1 mg Intravenous Given 05/01/17 1621)  ondansetron (ZOFRAN) injection 4 mg (4 mg Intravenous Given 05/01/17 1621)     Initial Impression / Assessment and Plan / ED Course  I have reviewed the triage vital signs and the nursing notes.  Pertinent labs & imaging results that were available during my care of the patient were reviewed by me and considered in my medical decision making (see chart for details).     52 yo F with a chief complaint of left leg pain.  The patient had a palate of fruit drinks fall onto her leg as she was in a kneeling position.  She has significant pain and swelling and so came to the ED.  Plain film is concerning for a lateral comminuted tibial plateau fracture.  Will discuss with orthopedics.  Discussed with Dr. Linna Caprice, recommends bulky splint, CT and [facility] where [facility].     The patients results and plan were reviewed and discussed.   Any x-rays performed were independently reviewed by myself.   Differential diagnosis were considered with the presenting HPI.  Medications  sertraline (ZOLOFT) tablet 100 mg (100 mg Oral Given 05/04/17 0849)  morphine 2 MG/ML injection 1-2 mg (2 mg Intravenous Given 05/03/17 1205)  acetaminophen (TYLENOL) tablet 650 mg (650 mg Oral Given 05/04/17 1209)    Or  acetaminophen (TYLENOL) suppository 650 mg ( Rectal See Alternative  05/04/17 1209)  ketorolac (TORADOL) 15 MG/ML injection 15 mg (15 mg Intravenous Given 05/04/17 1339)  acetaminophen (TYLENOL) tablet 650 mg (has no administration in time range)    Or  acetaminophen (TYLENOL) suppository 650 mg (has no administration in time range)  oxyCODONE (Oxy IR/ROXICODONE) immediate release tablet 5-15 mg (15 mg Oral Given 05/04/17 1338)  diphenhydrAMINE (BENADRYL) 12.5 MG/5ML elixir 12.5-25 mg (has no administration in time range)  docusate sodium (COLACE) capsule 100 mg (100 mg Oral Given 05/04/17 0848)  polyethylene glycol (MIRALAX / GLYCOLAX) packet 17 g (17 g Oral Not Given 05/04/17 0852)  pantoprazole (PROTONIX) EC tablet 40 mg (40 mg Oral Given 05/04/17 0850)  methocarbamol (ROBAXIN) tablet 750 mg (750 mg Oral Given 05/04/17 1338)  acetaminophen (TYLENOL) tablet 1,000 mg (1,000 mg Oral Given 05/01/17 1446)  oxyCODONE (Oxy IR/ROXICODONE) immediate release tablet 5 mg (5 mg Oral Given 05/01/17 1446)  diazepam (VALIUM) tablet 5 mg (5 mg Oral Given 05/01/17 1446)  ketorolac (TORADOL) 15 MG/ML injection 15 mg (15 mg Intramuscular Given 05/01/17 1446)  ketamine (KETALAR) injection 10 mg (10 mg Intravenous Given 05/01/17 1548)  HYDROmorphone (DILAUDID) injection 1 mg (1 mg Intravenous Given 05/01/17 1621)  ondansetron (ZOFRAN) injection 4 mg (4 mg Intravenous Given 05/01/17 1621)    Vitals:   05/03/17 1450 05/03/17 1935 05/04/17 0525 05/04/17 1421  BP: 108/71 113/68 114/62 117/67  Pulse: 70 84 61 72  Resp: 16 16 16 16   Temp: 98.5 F (36.9 C) 98 F (36.7 C) 97.8 F (36.6 C) (!) 97.4 F (36.3 C)  TempSrc: Oral Oral Oral Oral  SpO2: 95% 96% 96% 98%  Weight:      Height:        Final diagnoses:  Closed fracture of left tibial plateau, initial encounter    Admission/ observation were discussed with the admitting physician, patient and/or family and they are comfortable with the plan.    Final Clinical Impressions(s) / ED Diagnoses   Final diagnoses:  Closed fracture  of left tibial plateau, initial encounter    ED Discharge Orders    None       Melene Plan, DO 05/04/17 1457

## 2017-05-01 NOTE — ED Notes (Signed)
Dr. Adela LankFloyd [facility]  Dr. Adela LankFloyd spoke with Dr. Fredderick PhenixBelfi.  [facility] called and [facility] @ 3:33 pm

## 2017-05-01 NOTE — ED Notes (Addendum)
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 Pt leg reduced at [facility] and currently splinted. [facility] VS: 110/70, HR 80s, 91-93% on RA. A&Ox4. Pt reports 3/10 pain at this time

## 2017-05-01 NOTE — ED Notes (Signed)
Ortho at bedside.

## 2017-05-01 NOTE — ED Triage Notes (Signed)
Injury to her left lower leg. 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 No UDS required per pt. lg amt of swelling noted.

## 2017-05-01 NOTE — ED Provider Notes (Signed)
5:27 PM patient arrives to [facility] emergency department from [facility] where she was found to have a severely comminuted left tibial plateau fracture occurring today when a stack of cases of orange juice fell onto her leg.  Patient seen by Dr. Adela LankFloyd.  He spoke with Dr. Linna CapriceSwinteck who per note advised transfer to [facility] for admission.  Pain is currently controlled.  Left leg is immobilized in a long-leg splint.  Patient has sensation in her exposed toes and is able to wiggle them.  Normal capillary refill.  Patient last ate at 10 AM today.  She drank fluid last about 1:00 PM.  BP 140/82   Pulse 100   Temp 98.2 F (36.8 C) (Oral)   Resp 18   Ht 5\' 8"  (1.727 m)   Wt 71.7 kg (158 lb)   SpO2 96%   BMI 24.02 kg/m    5:40 PM Touched base with Dr. Linna CapriceSwinteck. He requests that patient be placed in Jones dressing. She is currently in long leg fiberglass splint.  Also she needs aggressive elevation of the leg above nose level. Will call ortho tech here.   Confirmed with him no surgery tonight. Will allow patient to drink. Water given.   Ortho tech to see.    5:57 PM Dr. Linna CapriceSwinteck has seen patient.      Renne CriglerGeiple, Jolanta Cabeza, PA-C 05/01/17 1757    Raeford RazorKohut, Stephen, MD 05/03/17 1006

## 2017-05-01 NOTE — ED Notes (Signed)
Low dose ketamine given for pain control

## 2017-05-01 NOTE — ED Notes (Signed)
EDP was in room and placed pt's leg in a straight position and EMT placing splint on pt. Pt remains to have good sensation and brisk ( < 3 sec) cap refill on reassessment.

## 2017-05-02 LAB — HIV ANTIBODY (ROUTINE TESTING W REFLEX): HIV SCREEN 4TH GENERATION: NONREACTIVE

## 2017-05-02 MED ORDER — PANTOPRAZOLE SODIUM 40 MG PO TBEC
40.0000 mg | DELAYED_RELEASE_TABLET | Freq: Every day | ORAL | Status: DC
  Administered 2017-05-02 – 2017-05-07 (×6): 40 mg via ORAL
  Filled 2017-05-02 (×6): qty 1

## 2017-05-02 MED ORDER — DIPHENHYDRAMINE HCL 12.5 MG/5ML PO ELIX: 12.5000 mg | ORAL_SOLUTION | ORAL | Status: DC | PRN

## 2017-05-02 MED ORDER — METHOCARBAMOL 750 MG PO TABS
750.0000 mg | ORAL_TABLET | Freq: Four times a day (QID) | ORAL | Status: DC
  Administered 2017-05-02 – 2017-05-07 (×19): 750 mg via ORAL
  Filled 2017-05-02 (×19): qty 1

## 2017-05-02 MED ORDER — ACETAMINOPHEN 650 MG RE SUPP: 650.0000 mg | Freq: Four times a day (QID) | RECTAL | Status: DC

## 2017-05-02 MED ORDER — KETOROLAC TROMETHAMINE 15 MG/ML IJ SOLN
15.0000 mg | Freq: Three times a day (TID) | INTRAMUSCULAR | Status: DC
  Administered 2017-05-02 – 2017-05-07 (×14): 15 mg via INTRAVENOUS
  Filled 2017-05-02 (×14): qty 1

## 2017-05-02 MED ORDER — METHOCARBAMOL 750 MG PO TABS: 750.0000 mg | ORAL_TABLET | Freq: Three times a day (TID) | ORAL | Status: DC

## 2017-05-02 MED ORDER — METHOCARBAMOL 1000 MG/10ML IJ SOLN: 500.0000 mg | Freq: Three times a day (TID) | INTRAVENOUS | Status: DC

## 2017-05-02 MED ORDER — ACETAMINOPHEN 650 MG RE SUPP: 650.0000 mg | Freq: Four times a day (QID) | RECTAL | Status: DC | PRN

## 2017-05-02 MED ORDER — OXYCODONE HCL 5 MG PO TABS
5.0000 mg | ORAL_TABLET | ORAL | Status: DC | PRN
  Administered 2017-05-02 – 2017-05-03 (×3): 15 mg via ORAL
  Administered 2017-05-03: 10 mg via ORAL
  Administered 2017-05-03 – 2017-05-07 (×15): 15 mg via ORAL
  Filled 2017-05-02 (×9): qty 3
  Filled 2017-05-02: qty 2
  Filled 2017-05-02 (×9): qty 3

## 2017-05-02 MED ORDER — ACETAMINOPHEN 325 MG PO TABS
650.0000 mg | ORAL_TABLET | Freq: Four times a day (QID) | ORAL | Status: DC
  Administered 2017-05-02 – 2017-05-07 (×16): 650 mg via ORAL
  Filled 2017-05-02 (×16): qty 2

## 2017-05-02 MED ORDER — POLYETHYLENE GLYCOL 3350 17 G PO PACK
17.0000 g | PACK | Freq: Every day | ORAL | Status: DC
  Filled 2017-05-02 (×4): qty 1

## 2017-05-02 MED ORDER — DOCUSATE SODIUM 100 MG PO CAPS
100.0000 mg | ORAL_CAPSULE | Freq: Two times a day (BID) | ORAL | Status: DC
  Administered 2017-05-02 – 2017-05-06 (×7): 100 mg via ORAL
  Filled 2017-05-02 (×9): qty 1

## 2017-05-02 MED ORDER — ACETAMINOPHEN 325 MG PO TABS
650.0000 mg | ORAL_TABLET | Freq: Four times a day (QID) | ORAL | Status: DC | PRN
  Administered 2017-05-05 – 2017-05-07 (×3): 650 mg via ORAL
  Filled 2017-05-02 (×2): qty 2

## 2017-05-02 NOTE — Progress Notes (Signed)
Subjective: The patient is 1 day s/p comminuted left lateral split/depression tibial plateau fracture with significant joint depression. [name] is laying in bed with the knee immobilizer in good position. She states that she is having moderate pain. She complains of intermittent muscle spasms and aching. She is tolerating food and liquid with no difficulty. She denies chest pain, shortness of breath, nausea, vomiting, diarrhea, fever or chills.    Objective: Vital signs in last 24 hours: Temp:  [98 F (36.7 C)-98.2 F (36.8 C)] 98.2 F (36.8 C) (04/19 0834) Pulse Rate:  [68-111] 75 (04/19 0834) Resp:  [18-22] 18 (04/19 0444) BP: (106-140)/(58-110) 116/81 (04/19 0834) SpO2:  [93 %-97 %] 96 % (04/19 0834) Weight:  [71.7 kg (158 lb)] 71.7 kg (158 lb) (04/18 2118)  Intake/Output from previous day: 04/18 0701 - 04/19 0700 In: 360 [P.O.:360] Out: -  Intake/Output this shift: No intake/output data recorded.  Recent Labs    05/01/17 1557 05/01/17 1911  HGB 12.7 12.2   Recent Labs    05/01/17 1557 05/01/17 1911  WBC 11.4* 10.7*  RBC 4.02 3.95  HCT 36.4 36.5  PLT 307 278   Recent Labs    05/01/17 1557 05/01/17 1911  NA 134*  --   K 3.9  --   CL 101  --   CO2 21*  --   BUN 19  --   CREATININE 0.69 0.77  GLUCOSE 117*  --   CALCIUM 9.2  --    No results for input(s): LABPT, INR in the last 72 hours.   Alert and Oriented x 4  Sensation intact distally Intact pulses distally Dorsiflexion/Plantar flexion intact but pain with dorsiflexion Capillary refill less than 2 seconds distally   Assessment/Plan: Continue with the knee immobilizer and elevation.  Continue with pain management as needed.  NWB of the LLE. Continue with compartment checks. Continue with regular diet as tolerated.   Amber Haley 05/02/2017, 9:38 AM

## 2017-05-02 NOTE — Consult Note (Signed)
Orthopaedic Trauma Service (OTS) Consult   Patient ID: [name] MRN: [id number] DOB/AGE: [birth year] 52 y.o.   Reason for Consult: Complex left tibial plateau fracture Referring Physician: Rod Can, MD (Ortho)   HPI: Amber Haley is an 52 y.o. white  female who was injured at work on 05/01/2017.  Patient works for [employer] in [employer].  She was working when a pallet of frozen drinks fell onto her left leg.  Patient had immediate onset of pain and inability to bear weight.  [facility] she was found to have an isolated left tibial plateau fracture.  On-call orthopedics was consulted.  Given her injury pattern we felt that admission for observation for compartment syndrome was warranted in addition to pain control aggressive ice and elevation.  Due to the complexity of the injury the orthopedic trauma service was consulted for definitive management as Dr. Lyla Haley asserted that this injury was out of the scope of his practice.  Patient was seen and evaluated by the orthopedic trauma service.  Of note her CT scan was performed prior to straightening her knee out.  She was fortunately placed into a bulky compressive dressing and knee immobilizer with much better alignment.  She does report pain in her left knee only.  Pain is exacerbated with motion but she is able to move her toes and ankle without too much trouble.  She denies any numbness or tingling in her left lower extremity.  Pain is relieved with pain medicine ice and remaining still patient denies any additional injuries.   Patient does smoke but has been trying to quit prior to her injury  She is married Works for the [employer] system  No other significant medical history  Past Medical History:  Diagnosis Date  . Anxiety   . GERD (gastroesophageal reflux disease)   . Tibial plateau fracture, left 05/01/2017   severely comminuted left tibial plateau fracture  occurring today when a stack of cases of orange juice fell onto her leg./notes 05/02/2017    Past Surgical History:  Procedure Laterality Date  . NO PAST SURGERIES      History reviewed. No pertinent family history.  Social History:  reports that she has been smoking cigarettes.  She has a 4.68 pack-year smoking history. She has never used smokeless tobacco. She reports that she drinks about 6.0 oz of alcohol per week. She reports that she has current or past drug history.  Allergies:  Allergies  Allergen Reactions  . Nsaids Nausea Only    Medications: I have reviewed the patient's current medications.  Results for orders placed or performed during the hospital encounter of 05/01/17 (from the past 48 hour(s))  CBC with Differential     Status: Abnormal   Collection Time: 05/01/17  3:57 PM  Result Value Ref Range   WBC 11.4 (H) 4.0 - 10.5 K/uL   RBC 4.02 3.87 - 5.11 MIL/uL   Hemoglobin 12.7 12.0 - 15.0 g/dL   HCT 36.4 36.0 - 46.0 %   MCV 90.5 78.0 - 100.0 fL   MCH 31.6 26.0 - 34.0 pg   MCHC 34.9 30.0 - 36.0 g/dL   RDW 12.1 11.5 - 15.5 %   Platelets 307 150 - 400 K/uL   Neutrophils Relative % 87 %   Neutro Abs 9.8 (H) 1.7 - 7.7 K/uL   Lymphocytes Relative 9 %   Lymphs Abs 1.0 0.7 - 4.0 K/uL  Monocytes Relative 4 %   Monocytes Absolute 0.5 0.1 - 1.0 K/uL   Eosinophils Relative 0 %   Eosinophils Absolute 0.0 0.0 - 0.7 K/uL   Basophils Relative 0 %   Basophils Absolute 0.0 0.0 - 0.1 K/uL    Comment: Performed at [facility], [address] [place], [place] [address]  Basic metabolic panel     Status: Abnormal   Collection Time: 05/01/17  3:57 PM  Result Value Ref Range   Sodium 134 (L) 135 - 145 mmol/L   Potassium 3.9 3.5 - 5.1 mmol/L   Chloride 101 101 - 111 mmol/L   CO2 21 (L) 22 - 32 mmol/L   Glucose, Bld 117 (H) 65 - 99 mg/dL   BUN 19 6 - 20 mg/dL   Creatinine, Ser 0.69 0.44 - 1.00 mg/dL   Calcium 9.2 8.9 - 10.3 mg/dL   GFR calc non Af Amer >60 >60  mL/min   GFR calc Af Amer >60 >60 mL/min    Comment: (NOTE) The eGFR has been calculated using the CKD EPI equation. This calculation has not been validated in all clinical situations. eGFR's persistently <60 mL/min signify possible Chronic Kidney Disease.    Anion gap 12 5 - 15    Comment: Performed at [facility], [address] [place], [place] [address]  HIV antibody (Routine Testing)     Status: None   Collection Time: 05/01/17  7:11 PM  Result Value Ref Range   HIV Screen 4th Generation wRfx Non Reactive Non Reactive    Comment: (NOTE) Performed At: [facility] [place], [place] [id number] Rush Farmer MD OB:[id number] Performed at [facility], [place] [address] [place], [place] [address]   CBC     Status: Abnormal   Collection Time: 05/01/17  7:11 PM  Result Value Ref Range   WBC 10.7 (H) 4.0 - 10.5 K/uL   RBC 3.95 3.87 - 5.11 MIL/uL   Hemoglobin 12.2 12.0 - 15.0 g/dL   HCT 36.5 36.0 - 46.0 %   MCV 92.4 78.0 - 100.0 fL   MCH 30.9 26.0 - 34.0 pg   MCHC 33.4 30.0 - 36.0 g/dL   RDW 12.7 11.5 - 15.5 %   Platelets 278 150 - 400 K/uL    Comment: Performed at [facility] [address], [place], [place] [address]  Creatinine, serum     Status: None   Collection Time: 05/01/17  7:11 PM  Result Value Ref Range   Creatinine, Ser 0.77 0.44 - 1.00 mg/dL   GFR calc non Af Amer >60 >60 mL/min   GFR calc Af Amer >60 >60 mL/min    Comment: (NOTE) The eGFR has been calculated using the CKD EPI equation. This calculation has not been validated in all clinical situations. eGFR's persistently <60 mL/min signify possible Chronic Kidney Disease. Performed at [facility], [place] [address], [place],  [address]     Dg Tibia/fibula Left  Result Date: 05/01/2017 CLINICAL DATA:  Left lower leg pain after a case of juices fell on her leg at work. EXAM: LEFT TIBIA AND FIBULA - 2 VIEW COMPARISON:  None. FINDINGS: Comminuted,  depressed intra-articular fracture of the lateral tibial plateau. A longitudinal nondisplaced component extends inferiorly into the proximal tibial diaphysis. The ankle is grossly unremarkable. Bone mineralization is normal. Small phleboliths in the anterior lower leg IMPRESSION: 1. Comminuted, depressed intra-articular fracture of the lateral tibial plateau.  Electronically Signed   By: Titus Dubin M.D.   On: 05/01/2017 14:46   Ct Knee Left Wo Contrast  Result Date: 05/01/2017 CLINICAL DATA:  Left knee pain.  Unable to straighten the knee. EXAM: CT OF THE LEFT KNEE WITHOUT CONTRAST TECHNIQUE: Multidetector CT imaging of the LEFT knee was performed according to the standard protocol. Multiplanar CT image reconstructions were also generated. COMPARISON:  None. FINDINGS: Bones/Joint/Cartilage Severely comminuted fracture of the lateral tibial plateau with 9 mm of depression involving an articular surface area of 30 x 20 mm. Subtle nondisplaced fracture cleft extends along the lateral margin of the tibia into the proximal tibial diaphysis. No other fracture or dislocation.  Small joint effusion. No aggressive osseous lesion. Ligaments Suboptimally assessed by CT. Muscles and Tendons Muscles are normal. No muscle atrophy. Quadriceps and patellar tendon are intact. Soft tissues No focal fluid collection or hematoma. Soft tissue contusion along the anterior aspect of the proximal tibia. IMPRESSION: 1. Severely comminuted fracture of the lateral tibial plateau with 9 mm of depression involving an articular surface area of 30 x 20 mm. Subtle nondisplaced fracture cleft extends along the lateral margin of the tibia into the proximal tibial diaphysis. Electronically Signed   By: Kathreen Devoid   On: 05/01/2017 16:12    Review of Systems  Constitutional: Negative for chills and fever.  Respiratory: Negative for shortness of breath and wheezing.   Cardiovascular: Negative for chest pain and palpitations.   Gastrointestinal: Negative for abdominal pain, nausea and vomiting.  Musculoskeletal: Positive for joint pain (left knee ).  Neurological: Negative for tingling and sensory change.   Blood pressure 116/81, pulse 75, temperature 98.2 F (36.8 C), temperature source Oral, resp. rate 18, height 5' 8"  (1.727 m), weight 71.7 kg (158 lb), SpO2 96 %. Physical Exam  Constitutional: She appears well-developed and well-nourished. She is cooperative. No distress.  HENT:  Head: Normocephalic and atraumatic.  Mouth/Throat: Oropharynx is clear and moist and mucous membranes are normal.  Eyes: EOM are normal.  Cardiovascular: Normal rate, regular rhythm, S1 normal and S2 normal.  Pulmonary/Chest: Effort normal. No respiratory distress.  Abdominal: Soft. Bowel sounds are normal.  Musculoskeletal:  Pelvis--no traumatic wounds or rash, no ecchymosis, stable to manual stress, nontender   Left lower extremity Inspection:    Knee immobilizer is intact Bulky compressive dressing from ankle to above knee No acute findings to the hip or ankle  Bony eval: Foot, ankle and hip are nontender No pain with axial loading or logrolling of the hip Pain with gentle palpation of the proximal tibia Patella and distal femur are nontender  Soft tissue: Did not remove bulky dressing as it is fitting well No significant swelling distally to the foot or ankle Did not assess knee stability as there is an acute tibial plateau fracture Ankle is grossly stable with stress evaluation  Sensation: DPN, SPN, TN sensory functions are grossly intact  Motor: EHL, FHL, anterior tibialis, posterior tibialis, peroneals and gastrocsoleus complex motor functions are grossly intact.  Vascular: Extremity is warm + DP pulse Compartments are soft and nontender.  No pain with passive stretching  Right lower extremity  No traumatic wounds, ecchymosis, or rash  Nontender  No knee or ankle effusion             No pain with  axial loading or logrolling of the hip                    Patient can perform  a straight leg raise       Knee stable to varus/ valgus and anterior/posterior stress             Ankle is stable with evaluation  Sens DPN, SPN, TN intact  Motor EHL, ext, flex, evers 5/5  DP 2+, PT 2+, No significant edema             Compartments are soft and nontender, no pain with passive stretching  Bilateral upper extremities           shoulder, elbow, wrist, digits- no skin wounds, nontender, no instability, no blocks to motion  Sens  Ax/R/M/U intact  Mot   Ax/ R/ PIN/ M/ AIN/ U intact  Rad 2+    Neurological: She is alert.  Skin: Skin is warm. Capillary refill takes less than 2 seconds.  Psychiatric: She has a normal mood and affect. Her speech is normal and behavior is normal. Judgment normal. Cognition and memory are normal.  Nursing note and vitals reviewed.    Assessment/Plan:  52 year old female with acute left tibial plateau fracture, injured at work  -Work related left leg injury  -Left split depression lateral tibial plateau fracture, Schatzker 2  Continue with inpatient admission for observation and pain control.  Aggressive soft tissue care ice and elevation of the left leg to maximize swelling control.  I do feel that the patient will be ready for surgery on Monday.  Her injury pattern is operative and will need surgery to restore alignment, stability, evaluate her meniscus and restore the joint surface congruity.   Patient will be nonweightbearing for 6-8 weeks postoperatively but will have unrestricted range of motion postoperatively   Patient can be out of bed with assist over the weekend with the therapies as well as with nursing staff  Knee immobilizer can be off when in bed.  Needs to be strapped on when mobilizing   Aggressive cryotherapy to the left leg.  Elevate left leg to the level of the heart to maximize swelling control  - Pain management:  Tylenol, OxyIR,  Robaxin  IV morphine for severe uncontrolled pain  - ABL anemia/Hemodynamics  Monitor labs - Medical issues   Nicotine dependence   Discussed the risks of continued nicotine use with respect to bone healing and wound healing.  Patient is already in the process of trying to quit  - DVT/PE prophylaxis:  lovenox  Hold Lovenox Sunday night - ID:   Perioperative antibiotics - Metabolic Bone Disease:  Metabolic bone workup given patient's age, nicotine use history and mechanism of injury  - Activity:   Nonweightbearing left leg  - FEN/GI prophylaxis/Foley/Lines:  Regular diet  NPO MN Monday for sugery in afternoon   - Impediments to fracture healing:  Nicotine use   - Dispo:  OR Monday for ORIF L tibial plateau fracture     Jari Pigg, PA-C [facility] [phone] (C) [phone] (O) 05/02/2017, 11:26 AM

## 2017-05-02 NOTE — Plan of Care (Signed)
  Problem: Education: Goal: Knowledge of General Education information will improve Outcome: Progressing   Problem: Education: Goal: Verbalization of understanding the information provided (i.e., activity precautions, restrictions, etc) will improve Outcome: Progressing   Problem: Activity: Goal: Ability to ambulate and perform ADLs will improve Outcome: Progressing   Problem: Clinical Measurements: Goal: Postoperative complications will be avoided or minimized Outcome: Progressing   Problem: Self-Concept: Goal: Ability to maintain and perform role responsibilities to the fullest extent possible will improve Outcome: Progressing   Problem: Pain Management: Goal: Pain level will decrease Outcome: Progressing

## 2017-05-02 NOTE — Progress Notes (Signed)
Orthopaedic Trauma Service  Pt seen and evaluated- full consult to follow  Left Lower extremity  No pain with passive stretching  Minimal swelling to ankle and foot Ext warm  + DP pulse Bulky compressive wrap from ankle to above knee  Can actively move toes and ankle w/o too much pain   Discussed treatment course with pt  NWB x 6-8 weeks post op ROM as tolerated post op   Will assume primary management on Monday   OR Monday afternoon at 1230  Ok to mobilize with PT/OT Can mobilize with walker with assistance as well  Aggressive ice and elevation to L leg to maximize swelling control Immobilizer only needs to be on when mobilizing  Bone health work up  Pt has nearly quit smoking and vows to completely quit effective immediately  Not craving any cigarettes at this time   Adjust pain meds   Mearl LatinKeith W. Kelsea Mousel, PA-C [facility] [phone](P) [phone] (C) [phone] (O) 05/02/2017 11:25 AM

## 2017-05-03 ENCOUNTER — Inpatient Hospital Stay (HOSPITAL_COMMUNITY): Payer: Worker's Compensation

## 2017-05-03 LAB — COMPREHENSIVE METABOLIC PANEL
ALT: 14 U/L (ref 14–54)
ANION GAP: 6 (ref 5–15)
AST: 17 U/L (ref 15–41)
Albumin: 3.4 g/dL — ABNORMAL LOW (ref 3.5–5.0)
Alkaline Phosphatase: 66 U/L (ref 38–126)
BUN: 8 mg/dL (ref 6–20)
CALCIUM: 8.7 mg/dL — AB (ref 8.9–10.3)
CO2: 26 mmol/L (ref 22–32)
Chloride: 105 mmol/L (ref 101–111)
Creatinine, Ser: 0.65 mg/dL (ref 0.44–1.00)
GFR calc non Af Amer: >60 mL/min (ref >60)
Glucose, Bld: 100 mg/dL — ABNORMAL HIGH (ref 65–99)
Potassium: 4.1 mmol/L (ref 3.5–5.1)
SODIUM: 137 mmol/L (ref 135–145)
Total Bilirubin: 0.5 mg/dL (ref 0.3–1.2)
Total Protein: 5.9 g/dL — ABNORMAL LOW (ref 6.5–8.1)

## 2017-05-03 LAB — CBC
HCT: 32.4 % — ABNORMAL LOW (ref 36.0–46.0)
Hemoglobin: 10.4 g/dL — ABNORMAL LOW (ref 12.0–15.0)
MCH: 30.3 pg (ref 26.0–34.0)
MCHC: 32.1 g/dL (ref 30.0–36.0)
MCV: 94.5 fL (ref 78.0–100.0)
PLATELETS: 228 10*3/uL (ref 150–400)
RBC: 3.43 MIL/uL — AB (ref 3.87–5.11)
RDW: 12.6 % (ref 11.5–15.5)
WBC: 6 10*3/uL (ref 4.0–10.5)

## 2017-05-03 LAB — HEMOGLOBIN A1C
Hgb A1c MFr Bld: 5 % (ref 4.8–5.6)
Mean Plasma Glucose: 96.8 mg/dL

## 2017-05-03 LAB — PHOSPHORUS: PHOSPHORUS: 3.6 mg/dL (ref 2.5–4.6)

## 2017-05-03 LAB — PREALBUMIN: Prealbumin: 27.3 mg/dL (ref 18–38)

## 2017-05-03 LAB — MAGNESIUM: MAGNESIUM: 1.9 mg/dL (ref 1.7–2.4)

## 2017-05-03 LAB — TSH: TSH: 2.606 u[IU]/mL (ref 0.350–4.500)

## 2017-05-03 NOTE — Evaluation (Signed)
Physical Therapy Evaluation Patient Details Name: Amber Haley MRN: [id number] DOB: 1965-05-12 Today's Date: 05/03/2017   History of Present Illness  52 yo female s/p LLE NWB x6-8 weeks due to comminuted left lateral split/depression tibial plateau fracture with significant joint depression. Pt pending surg 05/05/17 @12 :30pm [name], anxiety     Clinical Impression  Pt admitted with above diagnosis. Pt currently with functional limitations due to the deficits listed below (see PT Problem List). PTA, pt independent with all mobility. Upon eval pt presents with L leg pain and NWB status that limit her mobility. Currently min guard level for mobility, ambulating moderate distances in hallway without fatigue or increase in pain. Anticipate pt will do well and be able to return home with husbands support for after surgery.  Pt will benefit from skilled PT to increase their independence and safety with mobility to allow discharge to the venue listed below.       Follow Up Recommendations Home health PT;Supervision for mobility/OOB    Equipment Recommendations  3in1 (PT)(PT believes she has a walker to borrow)    Recommendations for Other Services       Precautions / Restrictions Precautions Precautions: Fall Restrictions Weight Bearing Restrictions: Yes LLE Weight Bearing: Non weight bearing      Mobility  Bed Mobility Overal bed mobility: Independent                Transfers Overall transfer level: Needs assistance Equipment used: Rolling walker (2 wheeled) Transfers: Sit to/from Stand Sit to Stand: Min guard         General transfer comment: cues for hand placement, pt with good adherence to NWB status.   Ambulation/Gait Ambulation/Gait assistance: Supervision;Min guard Ambulation Distance (Feet): 100 Feet Assistive device: Rolling walker (2 wheeled) Gait Pattern/deviations: Step-to pattern Gait velocity: decreased   General Gait Details: patient hop to gait,  good adherence to NWB status, progressed to supervision.  Stairs            Wheelchair Mobility    Modified Rankin (Stroke Patients Only)       Balance Overall balance assessment: Mild deficits observed, not formally tested(RW for standing balance due to NWB status)                                           Pertinent Vitals/Pain Pain Assessment: 0-10 Pain Score: 6  Pain Location: L LE Pain Descriptors / Indicators: Discomfort;Operative site guarding;Sore Pain Intervention(s): Limited activity within patient's tolerance;Monitored during session;Premedicated before session;Repositioned    Home Living Family/patient expects to be discharged to:: Private residence Living Arrangements: Spouse/significant other Available Help at Discharge: Family Type of Home: House Home Access: Stairs to enter   Secretary/administrator of Steps: 2 Home Layout: One level Home Equipment: Walker - 2 wheels;Hand held shower head;Shower seat Additional Comments: may be able to use sister 3n1. Dog that is a german shepard "[name]" / 3 cats    Prior Function Level of Independence: Independent         Comments: working and driving     Hand Dominance   Dominant Hand: Right    Extremity/Trunk Assessment   Upper Extremity Assessment Upper Extremity Assessment: Defer to OT evaluation    Lower Extremity Assessment Lower Extremity Assessment: (RLE 5/5 strength, LLE pending surgery NT) LLE Deficits / Details: pending surg 05/05/17 -KI at all times when up  Cervical / Trunk Assessment Cervical / Trunk Assessment: Normal  Communication   Communication: No difficulties  Cognition Arousal/Alertness: Awake/alert Behavior During Therapy: WFL for tasks assessed/performed Overall Cognitive Status: Within Functional Limits for tasks assessed                                        General Comments      Exercises     Assessment/Plan    PT Assessment  Patient needs continued PT services  PT Problem List Decreased strength;Decreased range of motion;Decreased activity tolerance;Decreased balance;Decreased mobility;Pain       PT Treatment Interventions DME instruction;Gait training;Stair training;Functional mobility training;Therapeutic activities;Therapeutic exercise;Balance training    PT Goals (Current goals can be found in the Care Plan section)  Acute Rehab PT Goals Patient Stated Goal: to return home to animals/ go on vacation PT Goal Formulation: With patient Time For Goal Achievement: 05/10/17 Potential to Achieve Goals: Good    Frequency Min 5X/week   Barriers to discharge        Co-evaluation               AM-PAC PT "6 Clicks" Daily Activity  Outcome Measure Difficulty turning over in bed (including adjusting bedclothes, sheets and blankets)?: A Little Difficulty moving from lying on back to sitting on the side of the bed? : A Little Difficulty sitting down on and standing up from a chair with arms (e.g., wheelchair, bedside commode, etc,.)?: A Little Help needed moving to and from a bed to chair (including a wheelchair)?: A Little Help needed walking in hospital room?: A Little Help needed climbing 3-5 steps with a railing? : A Little 6 Click Score: 18    End of Session Equipment Utilized During Treatment: Gait belt Activity Tolerance: Patient tolerated treatment well Patient left: in bed;with call bell/phone within reach;with nursing/sitter in room Nurse Communication: Mobility status PT Visit Diagnosis: Unsteadiness on feet (R26.81);Other abnormalities of gait and mobility (R26.89);Muscle weakness (generalized) (M62.81);Pain Pain - Right/Left: Left Pain - part of body: Leg    Time: 1631-1700 PT Time Calculation (min) (ACUTE ONLY): 29 min   Charges:   PT Evaluation $PT Eval Low Complexity: 1 Low PT Treatments $Gait Training: 8-22 mins   PT G Codes:       Etta GrandchildSean Undray Allman, PT, DPT Acute Rehab  Services Pager: [phone]    Etta GrandchildSean  Kaneesha Constantino 05/03/2017, 5:23 PM

## 2017-05-03 NOTE — Progress Notes (Signed)
Subjective: [name] is doing well this morning. She is sitting comfortably in bed icing the left leg. The Ace wrap is intact and the leg is elevated.  She is tolerating food and liquid with no difficulty. She is able to ambulate from the bed to the bathroom with a walker.  She denies fever, chills, nausea, vomiting, diarrhea, chest pain, or shortness of breath.  Objective: Vital signs in last 24 hours: Temp:  [98.3 F (36.8 C)-98.8 F (37.1 C)] 98.3 F (36.8 C) (04/20 0356) Pulse Rate:  [67-71] 68 (04/20 0356) Resp:  [15] 15 (04/20 0356) BP: (105-124)/(56-78) 124/66 (04/20 0356) SpO2:  [94 %-95 %] 95 % (04/20 0356)  Intake/Output from previous day: 04/19 0701 - 04/20 0700 In: 880 [P.O.:480; I.V.:400] Out: -  Intake/Output this shift: No intake/output data recorded.  Recent Labs    05/01/17 1557 05/01/17 1911 05/03/17 0709  HGB 12.7 12.2 10.4*   Recent Labs    05/01/17 1911 05/03/17 0709  WBC 10.7* 6.0  RBC 3.95 3.43*  HCT 36.5 32.4*  PLT 278 228   Recent Labs    05/01/17 1557 05/01/17 1911  NA 134*  --   K 3.9  --   CL 101  --   CO2 21*  --   BUN 19  --   CREATININE 0.69 0.77  GLUCOSE 117*  --   CALCIUM 9.2  --    No results for input(s): LABPT, INR in the last 72 hours.  Alert and oriented x 3. Neurovascular intact Sensation intact distally Intact pulses distally Dorsiflexion/Plantar flexion intact Compartment soft    Assessment/Plan: NWB LLE Continue with knee immobilizer, elevation, and ice.  Continue with pain management as needed.  Continue with compartment checks.  Continue with regular diet as tolerated.  We will plan on trauma service taking over care on Monday.   Karma GreaserSamantha Bonham Barton 05/03/2017, 8:34 AM

## 2017-05-03 NOTE — Evaluation (Addendum)
Occupational Therapy Evaluation Patient Details Name: Amber Haley MRN: [id number] DOB: [birth year] Today's Date: 05/03/2017    History of Present Illness 52 yo female s/p LLE NWB x6-8 weeks due to comminuted left lateral split/depression tibial plateau fracture with significant joint depression. Pt pending surg 05/05/17 @12 :30pm NWG:NFAOZH, anxiety   Clinical Impression   Patient is pending surgery 05/05/17 @12 :30 on L LE tibial plateau fx resulting in functional limitations due to the deficits listed below (see OT problem list). Pt plans to borrow DME from sister and is to double check that all recommend DME is available. Pt has 1 dog / 3 cats in the home and very much wants to return to them. Needs education on decr risk of infection with animals and education on when allowed to get into the swimming pool. Spouse asking 3 times during session about swimming pool and the use for therapy.  Patient will benefit from skilled OT acutely to increase independence and safety with ADLS to allow discharge home.  Also briefly discussed the entrance to the house using the front door due to less steps and ability to place chair to rest if needed. Spouse can pull call up the front steps to decr ambulation.      Follow Up Recommendations  No OT follow up    Equipment Recommendations  3 in 1 bedside commode;Other (comment)(recommending but sister has to borrow' RW)    Recommendations for Other Services PT consult     Precautions / Restrictions Precautions Precautions: Fall Restrictions Weight Bearing Restrictions: Yes LLE Weight Bearing: Non weight bearing      Mobility Bed Mobility Overal bed mobility: Independent             General bed mobility comments: pt has adjustable bed at home with head and foot electric movement  Transfers Overall transfer level: Needs assistance Equipment used: Rolling walker (2 wheeled) Transfers: Sit to/from Stand Sit to Stand: Min guard          General transfer comment: pt with excellent job maintaining NWB L LE    Balance                                           ADL either performed or assessed with clinical judgement   ADL Overall ADL's : Needs assistance/impaired Eating/Feeding: Independent   Grooming: Independent   Upper Body Bathing: Independent   Lower Body Bathing: Minimal assistance;Sit to/from stand   Upper Body Dressing : Independent   Lower Body Dressing: Minimal assistance;Sit to/from stand Lower Body Dressing Details (indicate cue type and reason): discussed dressing with reacher and dressing L LE first. pt reports "makes sense" Toilet Transfer: Min Proofreader Details (indicate cue type and reason): pt will have sink on Right side and RW for L UE. pt does not plan to use 3n1 in bathroom but rather for night time at eob Toileting- Clothing Manipulation and Hygiene: Ecologist Details (indicate cue type and reason): discussed use of walk in shower ( spouse shower) vs tub. spouse reports that toilet will prevent use of bench in teh bathroom Functional mobility during ADLs: Min guard;Rolling walker General ADL Comments: pt eager to return home to animals and much discussion between spouse/ patient regarding in ground swimming pool  Educated patient on k educated shower transfer,never to wash directly on incision site, always use fresh clean  linen (one time use then place in laundry),  Vision Baseline Vision/History: Wears glasses Wears Glasses: At all times       Perception     Praxis      Pertinent Vitals/Pain Pain Assessment: 0-10 Pain Score: 6  Pain Location: L LE Pain Descriptors / Indicators: Discomfort;Operative site guarding;Sore Pain Intervention(s): Monitored during session;Premedicated before session;Repositioned     Hand Dominance Right   Extremity/Trunk Assessment Upper Extremity Assessment Upper Extremity  Assessment: Overall WFL for tasks assessed   Lower Extremity Assessment Lower Extremity Assessment: LLE deficits/detail LLE Deficits / Details: pending surg 05/05/17 -KI at all times when up   Cervical / Trunk Assessment Cervical / Trunk Assessment: Normal   Communication Communication Communication: No difficulties   Cognition Arousal/Alertness: Awake/alert Behavior During Therapy: WFL for tasks assessed/performed Overall Cognitive Status: Within Functional Limits for tasks assessed                                     General Comments  educated on the need to wash around wounds at this time and use of clean linen each and everyt ime    Exercises     Shoulder Instructions      Home Living Family/patient expects to be discharged to:: Private residence Living Arrangements: Spouse/significant other Available Help at Discharge: Family Type of Home: House Home Access: Stairs to enter Secretary/administrator of Steps: 2(front door access)   Home Layout: One level     Bathroom Shower/Tub: Tub/shower unit;Walk-in shower(normally uses Tub / but can use walk in )   Bathroom Toilet: Handicapped height     Home Equipment: Walker - 2 wheels;Hand held shower head;Shower seat(electric bed so can elevate LE )   Additional Comments: may be able to use sister 3n1. Dog that is a german shepard "[name]" / 3 cats      Prior Functioning/Environment Level of Independence: Independent        Comments: working and driving        OT Problem List: Decreased strength;Decreased activity tolerance;Impaired balance (sitting and/or standing);Decreased safety awareness;Decreased knowledge of use of DME or AE;Decreased knowledge of precautions;Pain;Increased edema      OT Treatment/Interventions: Self-care/ADL training;Therapeutic exercise;DME and/or AE instruction;Therapeutic activities;Patient/family education;Balance training    OT Goals(Current goals can be found in the  care plan section) Acute Rehab OT Goals Patient Stated Goal: to return home to animals/ go on vacation OT Goal Formulation: With patient Time For Goal Achievement: 05/17/17 Potential to Achieve Goals: Good  OT Frequency: Min 2X/week   Barriers to D/C:            Co-evaluation              AM-PAC PT "6 Clicks" Daily Activity     Outcome Measure Help from another person eating meals?: None Help from another person taking care of personal grooming?: None Help from another person toileting, which includes using toliet, bedpan, or urinal?: A Little Help from another person bathing (including washing, rinsing, drying)?: A Little Help from another person to put on and taking off regular upper body clothing?: None Help from another person to put on and taking off regular lower body clothing?: A Little 6 Click Score: 21   End of Session Equipment Utilized During Treatment: Gait belt;Rolling walker Nurse Communication: Mobility status;Precautions;Weight bearing status  Activity Tolerance: Patient tolerated treatment well Patient left: in bed;with call bell/phone within reach;with family/visitor  present  OT Visit Diagnosis: Unsteadiness on feet (R26.81)                Time: 1355-1440 OT Time Calculation (min): 45 min Charges:  OT General Charges $OT Visit: 1 Visit OT Evaluation $OT Eval Moderate Complexity: 1 Mod OT Treatments $Self Care/Home Management : 23-37 mins G-Codes:      Mateo FlowJones, Brynn   OTR/L Pager: [phone] Office: [phone] .   Boone MasterJones, Jacere Pangborn B 05/03/2017, 2:52 PM

## 2017-05-04 LAB — SURGICAL PCR SCREEN
MRSA, PCR: NEGATIVE
STAPHYLOCOCCUS AUREUS: NEGATIVE

## 2017-05-04 NOTE — Plan of Care (Signed)
  Problem: Activity: Goal: Ability to ambulate and perform ADLs will improve Outcome: Progressing   

## 2017-05-04 NOTE — Progress Notes (Signed)
Subjective: Pt admitted for L tibial plateau fracture after a work injury 2 days ago.  She c/o mild pain in the L knee at rest.  Tolerating regular diet.  OR scheduled tomorrow with Dr. Jena GaussHaddix.  She denies numbness, tingling or weakness in the L LE.   Objective: Vital signs in last 24 hours: Temp:  [97.8 F (36.6 C)-98.5 F (36.9 C)] 97.8 F (36.6 C) (04/21 0525) Pulse Rate:  [61-84] 61 (04/21 0525) Resp:  [16] 16 (04/21 0525) BP: (108-114)/(62-71) 114/62 (04/21 0525) SpO2:  [95 %-96 %] 96 % (04/21 0525)  Intake/Output from previous day: 04/20 0701 - 04/21 0700 In: 360 [P.O.:360] Out: -  Intake/Output this shift: No intake/output data recorded.  Recent Labs    05/01/17 1557 05/01/17 1911 05/03/17 0709  HGB 12.7 12.2 10.4*   Recent Labs    05/01/17 1911 05/03/17 0709  WBC 10.7* 6.0  RBC 3.95 3.43*  HCT 36.5 32.4*  PLT 278 228   Recent Labs    05/01/17 1557 05/01/17 1911 05/03/17 0709  NA 134*  --  137  K 3.9  --  4.1  CL 101  --  105  CO2 21*  --  26  BUN 19  --  8  CREATININE 0.69 0.77 0.65  GLUCOSE 117*  --  100*  CALCIUM 9.2  --  8.7*   No results for input(s): LABPT, INR in the last 72 hours.  PE:  L LE immobilized in a knee immobilizer.  2+ dp and pt pulses at the left foot.  Sens to LT intact dorsally and platnarly at the foot.  5/5 strength in PF and DF of the ankle and toes.  No pain iwth passive stretch of the deep / superficial post compartments or anterior compartment.      Assessment/Plan: L tibial plateau fracture - to OR tomorrow.  NPO after midnight.  D/c blood thinners.  Orders entered.   Toni ArthursHEWITT, Janazia Schreier 05/04/2017, 9:13 AM

## 2017-05-05 ENCOUNTER — Inpatient Hospital Stay (HOSPITAL_COMMUNITY): Payer: Worker's Compensation

## 2017-05-05 ENCOUNTER — Encounter (HOSPITAL_COMMUNITY): Payer: Self-pay | Admitting: Orthopedic Surgery

## 2017-05-05 ENCOUNTER — Encounter (HOSPITAL_COMMUNITY)
Admission: EM | Disposition: A | Discharge status: Home Health | Payer: Self-pay | Source: Home / Self Care | Attending: Orthopedic Surgery

## 2017-05-05 ENCOUNTER — Inpatient Hospital Stay (HOSPITAL_COMMUNITY): Payer: Worker's Compensation | Admitting: Anesthesiology

## 2017-05-05 DIAGNOSIS — F17200 Nicotine dependence, unspecified, uncomplicated: Secondary | ICD-10-CM | POA: Diagnosis present

## 2017-05-05 HISTORY — PX: ORIF TIBIA PLATEAU: SHX2132

## 2017-05-05 HISTORY — DX: Nicotine dependence, unspecified, uncomplicated: F17.200

## 2017-05-05 LAB — COMPREHENSIVE METABOLIC PANEL
ALBUMIN: 3.6 g/dL (ref 3.5–5.0)
ALT: 39 U/L (ref 14–54)
AST: 42 U/L — AB (ref 15–41)
Alkaline Phosphatase: 96 U/L (ref 38–126)
Anion gap: 11 (ref 5–15)
BILIRUBIN TOTAL: 0.7 mg/dL (ref 0.3–1.2)
BUN: 9 mg/dL (ref 6–20)
CHLORIDE: 106 mmol/L (ref 101–111)
CO2: 24 mmol/L (ref 22–32)
Calcium: 9.2 mg/dL (ref 8.9–10.3)
Creatinine, Ser: 0.72 mg/dL (ref 0.44–1.00)
GFR calc Af Amer: >60 mL/min (ref >60)
Glucose, Bld: 107 mg/dL — ABNORMAL HIGH (ref 65–99)
POTASSIUM: 4.2 mmol/L (ref 3.5–5.1)
Sodium: 141 mmol/L (ref 135–145)
TOTAL PROTEIN: 6.5 g/dL (ref 6.5–8.1)

## 2017-05-05 LAB — PTH, INTACT AND CALCIUM
Calcium, Total (PTH): 8.5 mg/dL — ABNORMAL LOW (ref 8.7–10.2)
PTH: 28 pg/mL (ref 15–65)

## 2017-05-05 LAB — ABO/RH: ABO/RH(D): A POS

## 2017-05-05 LAB — CALCITRIOL (1,25 DI-OH VIT D): VIT D 1 25 DIHYDROXY: 44.2 pg/mL (ref 19.9–79.3)

## 2017-05-05 LAB — PROTIME-INR
INR: 0.9
PROTHROMBIN TIME: 12 s (ref 11.4–15.2)

## 2017-05-05 LAB — CBC
HEMATOCRIT: 31.6 % — AB (ref 36.0–46.0)
HEMOGLOBIN: 10.6 g/dL — AB (ref 12.0–15.0)
MCH: 31 pg (ref 26.0–34.0)
MCHC: 33.5 g/dL (ref 30.0–36.0)
MCV: 92.4 fL (ref 78.0–100.0)
Platelets: 279 10*3/uL (ref 150–400)
RBC: 3.42 MIL/uL — ABNORMAL LOW (ref 3.87–5.11)
RDW: 12.7 % (ref 11.5–15.5)
WBC: 7.5 10*3/uL (ref 4.0–10.5)

## 2017-05-05 LAB — TYPE AND SCREEN
ABO/RH(D): A POS
ANTIBODY SCREEN: NEGATIVE

## 2017-05-05 LAB — VITAMIN D 25 HYDROXY (VIT D DEFICIENCY, FRACTURES): VIT D 25 HYDROXY: 19.8 ng/mL — AB (ref 30.0–100.0)

## 2017-05-05 SURGERY — OPEN REDUCTION INTERNAL FIXATION (ORIF) TIBIAL PLATEAU
Anesthesia: General | Site: Knee | Laterality: Left

## 2017-05-05 MED ORDER — BACITRACIN ZINC 500 UNIT/GM EX OINT
TOPICAL_OINTMENT | CUTANEOUS | Status: AC
  Filled 2017-05-05: qty 28.35

## 2017-05-05 MED ORDER — BACITRACIN 500 UNIT/GM EX OINT
TOPICAL_OINTMENT | CUTANEOUS | Status: DC | PRN
  Administered 2017-05-05: 1 via TOPICAL

## 2017-05-05 MED ORDER — CEFAZOLIN SODIUM-DEXTROSE 1-4 GM/50ML-% IV SOLN
1.0000 g | INTRAVENOUS | Status: AC
  Filled 2017-05-05: qty 50

## 2017-05-05 MED ORDER — FENTANYL CITRATE (PF) 250 MCG/5ML IJ SOLN
INTRAMUSCULAR | Status: AC
  Filled 2017-05-05: qty 5

## 2017-05-05 MED ORDER — HYDROMORPHONE HCL 2 MG/ML IJ SOLN
2.0000 mg | Freq: Once | INTRAMUSCULAR | Status: AC
  Administered 2017-05-05: 2 mg via INTRAVENOUS
  Filled 2017-05-05: qty 1

## 2017-05-05 MED ORDER — DEXAMETHASONE SODIUM PHOSPHATE 10 MG/ML IJ SOLN
INTRAMUSCULAR | Status: AC
  Filled 2017-05-05: qty 1

## 2017-05-05 MED ORDER — HYDROMORPHONE HCL 2 MG/ML IJ SOLN
0.2500 mg | INTRAMUSCULAR | Status: DC | PRN
  Administered 2017-05-05 (×4): 0.5 mg via INTRAVENOUS

## 2017-05-05 MED ORDER — VANCOMYCIN HCL 1000 MG IV SOLR
INTRAVENOUS | Status: DC | PRN
  Administered 2017-05-05: 1000 mg via TOPICAL

## 2017-05-05 MED ORDER — DEXMEDETOMIDINE HCL IN NACL 200 MCG/50ML IV SOLN
INTRAVENOUS | Status: AC
  Filled 2017-05-05: qty 50

## 2017-05-05 MED ORDER — MIDAZOLAM HCL 2 MG/2ML IJ SOLN
INTRAMUSCULAR | Status: AC
  Filled 2017-05-05: qty 2

## 2017-05-05 MED ORDER — FENTANYL CITRATE (PF) 100 MCG/2ML IJ SOLN
INTRAMUSCULAR | Status: DC | PRN
  Administered 2017-05-05: 50 ug via INTRAVENOUS
  Administered 2017-05-05 (×2): 150 ug via INTRAVENOUS
  Administered 2017-05-05: 100 ug via INTRAVENOUS
  Administered 2017-05-05: 50 ug via INTRAVENOUS
  Administered 2017-05-05: 100 ug via INTRAVENOUS
  Administered 2017-05-05: 50 ug via INTRAVENOUS
  Administered 2017-05-05 (×2): 100 ug via INTRAVENOUS
  Administered 2017-05-05: 50 ug via INTRAVENOUS
  Administered 2017-05-05: 100 ug via INTRAVENOUS

## 2017-05-05 MED ORDER — CEFAZOLIN SODIUM-DEXTROSE 2-4 GM/100ML-% IV SOLN
2.0000 g | Freq: Three times a day (TID) | INTRAVENOUS | Status: AC
  Administered 2017-05-05 – 2017-05-06 (×3): 2 g via INTRAVENOUS
  Filled 2017-05-05 (×3): qty 100

## 2017-05-05 MED ORDER — MIDAZOLAM HCL 5 MG/5ML IJ SOLN
INTRAMUSCULAR | Status: DC | PRN
  Administered 2017-05-05: 2 mg via INTRAVENOUS

## 2017-05-05 MED ORDER — OXYCODONE HCL 5 MG PO TABS
ORAL_TABLET | ORAL | Status: AC
  Administered 2017-05-06: 15 mg via ORAL
  Filled 2017-05-05: qty 3

## 2017-05-05 MED ORDER — LIDOCAINE HCL (CARDIAC) PF 100 MG/5ML IV SOSY
PREFILLED_SYRINGE | INTRAVENOUS | Status: DC | PRN
  Administered 2017-05-05: 50 mg via INTRAVENOUS

## 2017-05-05 MED ORDER — FENTANYL CITRATE (PF) 250 MCG/5ML IJ SOLN
INTRAMUSCULAR | Status: AC
Start: 2017-05-05 — End: ?
  Filled 2017-05-05: qty 5

## 2017-05-05 MED ORDER — HYDROMORPHONE HCL 2 MG/ML IJ SOLN
INTRAMUSCULAR | Status: AC
Start: 2017-05-05 — End: 2017-05-06
  Filled 2017-05-05: qty 1

## 2017-05-05 MED ORDER — POVIDONE-IODINE 10 % EX SWAB: 2.0000 "application " | Freq: Once | CUTANEOUS | Status: DC

## 2017-05-05 MED ORDER — PROPOFOL 10 MG/ML IV BOLUS
INTRAVENOUS | Status: AC
  Filled 2017-05-05: qty 20

## 2017-05-05 MED ORDER — CEFAZOLIN SODIUM-DEXTROSE 2-3 GM-%(50ML) IV SOLR
INTRAVENOUS | Status: DC | PRN
  Administered 2017-05-05: 2 g via INTRAVENOUS

## 2017-05-05 MED ORDER — SUGAMMADEX SODIUM 200 MG/2ML IV SOLN
INTRAVENOUS | Status: DC | PRN
  Administered 2017-05-05: 100 mg via INTRAVENOUS

## 2017-05-05 MED ORDER — CHLORHEXIDINE GLUCONATE 4 % EX LIQD
60.0000 mL | Freq: Once | CUTANEOUS | Status: AC
  Administered 2017-05-05: 4 via TOPICAL
  Filled 2017-05-05: qty 60

## 2017-05-05 MED ORDER — 0.9 % SODIUM CHLORIDE (POUR BTL) OPTIME
TOPICAL | Status: DC | PRN
  Administered 2017-05-05: 1000 mL

## 2017-05-05 MED ORDER — DEXMEDETOMIDINE HCL IN NACL 200 MCG/50ML IV SOLN
INTRAVENOUS | Status: DC | PRN
  Administered 2017-05-05: 8 ug via INTRAVENOUS
  Administered 2017-05-05: 12 ug via INTRAVENOUS
  Administered 2017-05-05: 8 ug via INTRAVENOUS
  Administered 2017-05-05: 12 ug via INTRAVENOUS

## 2017-05-05 MED ORDER — ONDANSETRON HCL 4 MG/2ML IJ SOLN
INTRAMUSCULAR | Status: AC
Start: 2017-05-05 — End: ?
  Filled 2017-05-05: qty 2

## 2017-05-05 MED ORDER — KETOROLAC TROMETHAMINE 30 MG/ML IJ SOLN
30.0000 mg | Freq: Once | INTRAMUSCULAR | Status: AC | PRN
  Administered 2017-05-05: 30 mg via INTRAVENOUS

## 2017-05-05 MED ORDER — ENOXAPARIN SODIUM 40 MG/0.4ML ~~LOC~~ SOLN
40.0000 mg | SUBCUTANEOUS | Status: DC
  Administered 2017-05-06 – 2017-05-07 (×2): 40 mg via SUBCUTANEOUS
  Filled 2017-05-05 (×2): qty 0.4

## 2017-05-05 MED ORDER — PHENYLEPHRINE HCL 10 MG/ML IJ SOLN
INTRAVENOUS | Status: DC | PRN
  Administered 2017-05-05: 10 ug/min via INTRAVENOUS

## 2017-05-05 MED ORDER — LACTATED RINGERS IV SOLN
INTRAVENOUS | Status: DC
  Administered 2017-05-05 (×3): via INTRAVENOUS

## 2017-05-05 MED ORDER — PROMETHAZINE HCL 25 MG/ML IJ SOLN: 6.2500 mg | INTRAMUSCULAR | Status: DC | PRN

## 2017-05-05 MED ORDER — DEXAMETHASONE SODIUM PHOSPHATE 10 MG/ML IJ SOLN
INTRAMUSCULAR | Status: DC | PRN
  Administered 2017-05-05: 5 mg via INTRAVENOUS

## 2017-05-05 MED ORDER — MEPERIDINE HCL 50 MG/ML IJ SOLN: 6.2500 mg | INTRAMUSCULAR | Status: DC | PRN

## 2017-05-05 MED ORDER — KETOROLAC TROMETHAMINE 30 MG/ML IJ SOLN
INTRAMUSCULAR | Status: AC
  Filled 2017-05-05: qty 1

## 2017-05-05 MED ORDER — SUGAMMADEX SODIUM 200 MG/2ML IV SOLN
INTRAVENOUS | Status: AC
  Filled 2017-05-05: qty 2

## 2017-05-05 MED ORDER — PROPOFOL 10 MG/ML IV BOLUS
INTRAVENOUS | Status: DC | PRN
  Administered 2017-05-05: 120 mg via INTRAVENOUS

## 2017-05-05 MED ORDER — VANCOMYCIN HCL 1000 MG IV SOLR
INTRAVENOUS | Status: DC | PRN
  Administered 2017-05-05: 1000 mg

## 2017-05-05 MED ORDER — ROCURONIUM BROMIDE 100 MG/10ML IV SOLN
INTRAVENOUS | Status: DC | PRN
  Administered 2017-05-05: 20 mg via INTRAVENOUS
  Administered 2017-05-05: 40 mg via INTRAVENOUS

## 2017-05-05 MED ORDER — ONDANSETRON HCL 4 MG/2ML IJ SOLN
INTRAMUSCULAR | Status: DC | PRN
  Administered 2017-05-05: 4 mg via INTRAVENOUS

## 2017-05-05 SURGICAL SUPPLY — 77 items
BANDAGE ACE 4X5 VEL STRL LF (GAUZE/BANDAGES/DRESSINGS) ×3 IMPLANT
BANDAGE ACE 6X5 VEL STRL LF (GAUZE/BANDAGES/DRESSINGS) ×3 IMPLANT
BANDAGE ESMARK 6X9 LF (GAUZE/BANDAGES/DRESSINGS) ×1 IMPLANT
BIT DRILL 2.5 X LONG (BIT) ×1
BIT DRILL CALIBR QC 2.8X250 (BIT) ×3 IMPLANT
BIT DRILL X LONG 2.5 (BIT) ×1 IMPLANT
BLADE CLIPPER SURG (BLADE) IMPLANT
BLADE SURG 15 STRL LF DISP TIS (BLADE) ×1 IMPLANT
BLADE SURG 15 STRL SS (BLADE) ×2
BNDG ESMARK 6X9 LF (GAUZE/BANDAGES/DRESSINGS) ×3
BNDG GAUZE ELAST 4 BULKY (GAUZE/BANDAGES/DRESSINGS) ×3 IMPLANT
BONE CANC CHIPS 20CC PCAN1/4 (Bone Implant) ×3 IMPLANT
BRUSH SCRUB SURG 4.25 DISP (MISCELLANEOUS) ×6 IMPLANT
CANISTER SUCT 3000ML PPV (MISCELLANEOUS) ×3 IMPLANT
CHIPS CANC BONE 20CC PCAN1/4 (Bone Implant) ×1 IMPLANT
CHLORAPREP W/TINT 26ML (MISCELLANEOUS) ×6 IMPLANT
COVER SURGICAL LIGHT HANDLE (MISCELLANEOUS) ×3 IMPLANT
CUFF TOURNIQUET SINGLE 34IN LL (TOURNIQUET CUFF) ×3 IMPLANT
DRAPE C-ARM 42X72 X-RAY (DRAPES) ×3 IMPLANT
DRAPE C-ARMOR (DRAPES) ×3 IMPLANT
DRAPE ORTHO SPLIT 77X108 STRL (DRAPES) ×4
DRAPE SURG ORHT 6 SPLT 77X108 (DRAPES) ×2 IMPLANT
DRAPE U-SHAPE 47X51 STRL (DRAPES) ×3 IMPLANT
DRILL BIT X LONG 2.5 (BIT) ×2
DRSG ADAPTIC 3X8 NADH LF (GAUZE/BANDAGES/DRESSINGS) ×3 IMPLANT
DRSG PAD ABDOMINAL 8X10 ST (GAUZE/BANDAGES/DRESSINGS) ×6 IMPLANT
ELECT REM PT RETURN 9FT ADLT (ELECTROSURGICAL) ×3
ELECTRODE REM PT RTRN 9FT ADLT (ELECTROSURGICAL) ×1 IMPLANT
GAUZE SPONGE 4X4 12PLY STRL (GAUZE/BANDAGES/DRESSINGS) ×3 IMPLANT
GAUZE SPONGE 4X4 12PLY STRL LF (GAUZE/BANDAGES/DRESSINGS) ×3 IMPLANT
GLOVE BIO SURGEON STRL SZ7.5 (GLOVE) ×12 IMPLANT
GLOVE BIOGEL PI IND STRL 7.5 (GLOVE) ×1 IMPLANT
GLOVE BIOGEL PI INDICATOR 7.5 (GLOVE) ×2
GOWN STRL REUS W/ TWL LRG LVL3 (GOWN DISPOSABLE) ×2 IMPLANT
GOWN STRL REUS W/TWL LRG LVL3 (GOWN DISPOSABLE) ×4
IMMOBILIZER KNEE 22 UNIV (SOFTGOODS) ×3 IMPLANT
KIT BASIN OR (CUSTOM PROCEDURE TRAY) ×3 IMPLANT
KIT TURNOVER KIT B (KITS) ×3 IMPLANT
NDL SUT 6 .5 CRC .975X.05 MAYO (NEEDLE) ×1 IMPLANT
NEEDLE MAYO TAPER (NEEDLE) ×2
NS IRRIG 1000ML POUR BTL (IV SOLUTION) ×3 IMPLANT
PACK TOTAL JOINT (CUSTOM PROCEDURE TRAY) ×3 IMPLANT
PAD ARMBOARD 7.5X6 YLW CONV (MISCELLANEOUS) ×6 IMPLANT
PAD CAST 4YDX4 CTTN HI CHSV (CAST SUPPLIES) ×1 IMPLANT
PADDING CAST COTTON 4X4 STRL (CAST SUPPLIES) ×2
PADDING CAST COTTON 6X4 STRL (CAST SUPPLIES) ×3 IMPLANT
PLATE PROX TIBIA 6H LT 3.5 VA (Plate) ×3 IMPLANT
SCREW CORTEX 3.5 32MM (Screw) ×2 IMPLANT
SCREW CORTEX 3.5 36MM (Screw) ×2 IMPLANT
SCREW CORTEX 3.5 45MM (Screw) ×3 IMPLANT
SCREW LOCK CORT ST 3.5X32 (Screw) ×1 IMPLANT
SCREW LOCK CORT ST 3.5X36 (Screw) ×1 IMPLANT
SCREW LOCKING 3.5X70MM VA (Screw) ×6 IMPLANT
SCREW LOCKING VA 3.5X75MM (Screw) ×3 IMPLANT
SCREW PELVIC CORT ST 3.5X75 (Screw) ×2 IMPLANT
SCREW SELF TAP 3.5 75M (Screw) ×4 IMPLANT
SCREW VA-LOCKING 65MM 3.5 (Screw) ×3 IMPLANT
STAPLER VISISTAT 35W (STAPLE) ×3 IMPLANT
SUCTION FRAZIER HANDLE 10FR (MISCELLANEOUS) ×2
SUCTION TUBE FRAZIER 10FR DISP (MISCELLANEOUS) ×1 IMPLANT
SUT ETHILON 2 0 FS 18 (SUTURE) ×3 IMPLANT
SUT ETHILON 3 0 PS 1 (SUTURE) IMPLANT
SUT FIBERWIRE #2 38 T-5 BLUE (SUTURE) ×6
SUT VIC AB 0 CT1 18XCR BRD 8 (SUTURE) ×1 IMPLANT
SUT VIC AB 0 CT1 27 (SUTURE)
SUT VIC AB 0 CT1 27XBRD ANBCTR (SUTURE) IMPLANT
SUT VIC AB 0 CT1 8-18 (SUTURE) ×2
SUT VIC AB 1 CT1 18XCR BRD 8 (SUTURE) IMPLANT
SUT VIC AB 1 CT1 27 (SUTURE) ×4
SUT VIC AB 1 CT1 27XBRD ANBCTR (SUTURE) ×2 IMPLANT
SUT VIC AB 1 CT1 8-18 (SUTURE)
SUT VIC AB 2-0 CT1 27 (SUTURE) ×4
SUT VIC AB 2-0 CT1 TAPERPNT 27 (SUTURE) ×2 IMPLANT
SUTURE FIBERWR #2 38 T-5 BLUE (SUTURE) ×2 IMPLANT
TOWEL OR 17X26 10 PK STRL BLUE (TOWEL DISPOSABLE) ×6 IMPLANT
TRAY FOLEY W/METER SILVER 16FR (SET/KITS/TRAYS/PACK) IMPLANT
WATER STERILE IRR 1000ML POUR (IV SOLUTION) ×6 IMPLANT

## 2017-05-05 NOTE — Progress Notes (Signed)
PT Cancellation Note  Patient Details Name: Amber Haley MRN: [id number] DOB: 12/07/1965   Cancelled Treatment:    Reason Eval/Treat Not Completed: (P) Patient at procedure or test/unavailable(Pt to OR for ORIF L Tib. Will continue efforts per POC.)   Amber Haley 05/05/2017, 3:59 PM  Amber Haley, SPTA [id number]

## 2017-05-05 NOTE — Progress Notes (Signed)
Awaiting transport help

## 2017-05-05 NOTE — Op Note (Signed)
OrthopaedicSurgeryOperativeNote (OZH:[id number]) Date of Surgery: 05/05/2017  Admit Date: 05/01/2017   Diagnoses: Pre-Op Diagnoses: Left Schatzker II tibial plateau fracture  Post-Op Diagnosis: Same  Procedures: 1. CPT 27235-ORIF of left tibial plateau fracture 2. CPT 27403-Repair of lateral meniscus tear  Surgeons: Primary: Alecsander Hattabaugh, Gillie Manners, MD   Assistant: Montez Morita, PA-C  Location:[facility] OR ROOM 07   AnesthesiaGeneral   Antibiotics:Ancef 2g preop   Tourniquettime: Total Tourniquet Time Documented: Thigh (Left) - 135 minutes Total: Thigh (Left) - 135 minutes   EstimatedBloodLoss:100 mL   Complications:None  Specimens:None  Implants: Implant Name Type Inv. Item Serial No. Manufacturer Lot No. LRB No. Used Action  BONE CANC CHIPS 20CC - [id number] Bone Implant BONE [facility] CHIPS 20CC [id number] [employer] [place] [employer] [facility]  Left 1 Implanted  SCREW CORTEX 3.5 - [id number] Screw SCREW CORTEX 3.5  [employer]  Left 1 Implanted  SCREW CORTEX 3.5 - [id number] Screw SCREW CORTEX 3.5  [employer]  Left 1 Implanted  SCREW CORTEX 3.5 - [id number] Screw SCREW CORTEX 3.5  [employer]  Left 1 Implanted  SCREW SELF TAP 3.5 80M - [id number] Screw SCREW SELF TAP 3.5 80M  [employer]  Left 1 Implanted  PLATE PROX TIBIA 6H LT 3.5 VA - [id number] Plate PLATE PROX TIBIA 6H LT 3.5 VA  [employer]  Left 1 Implanted  SCREW LOCKING VA 3.5X80MM - [id number] Screw SCREW LOCKING VA 3.5X80MM  [employer]  Left 1 Implanted  SCREW LOCKING 3.5X70MM VA - [id number] Screw SCREW LOCKING 3.5X70MM VA  [employer]  Left 2 Implanted  SCREW VA-LOCKING 3.5 - [id number] Screw SCREW VA-LOCKING 3.5  [employer]  Left 1 Implanted  SCREW SELF TAP 3.5 80M - [id number] Screw SCREW SELF TAP 3.5 80M  [employer]  Left 1 Implanted  2.8 drill      Left 1 Implanted    IndicationsforSurgery: 52 year old female who was injured at work. A pallet of  frozen drinks fell onto her left leg and she had immediate pain and deformity. She was found to have a left lateral tibial plateau fracture. Due to the complexity of her injury the orthopaedic trauma service was asked to take over her care. I recommeded for optimal outcome and function to proceed with ORIF. I discussed risks and benefits and plan to proceed with surgery. Risks discussed included bleeding requiring blood transfusion, bleeding causing a hematoma, infection, malunion, nonunion, damage to surrounding nerves and blood vessels, pain, hardware prominence or irritation, hardware failure, stiffness, post-traumatic arthritis, DVT/PE, compartment syndrome, and even death. Patient agrees to proceed with surgery.Risks and benefits were extensively discussed as noted above and the patient and their family agreed to proceed with surgery and consent was obtained.  Operative Findings: 1. Left Schatzker II tibial plateau fracture with significant lateral joint impaction treated with open reduction internal fixation using Synthes 3.19mm VA 6 hole proximal tibial locking plate. 2. Bone void from metaphyseal impaction filled with crushed cancellous allograft. 3. Peripheral tear of anterior mensicus repaired with #2 Fiberwire suture  Procedure: The patient was identified in the preoperative holding area. All questions were answered and the operative extremity was marked. The patient was brought back to the operating room by our anesthesia colleagues. She was carefully transferred over to a radiolucent flattop table and placed under general anesthesia. A nonsterile tourniquet was placed to her upper thigh and a bump was placed under her operative hip. The operative extremity was then prepped and  draped in usual sterile fashion. A preoperative timeout was performed to verify the patient, the procedure, and the extremity. Preoperative antibiotics were dosed.  Fluoroscopy was used evaluate the injury and were saved.  An esmarch was used to exsanguinate the leg and it was inflated to 300mmHg. An anterolateral parapatellar incision was performed and carried through skin and subcutaneous tissue. The overlying fascia was incised in line with the skin incision just lateral to the patellar tendon. It was extended distally into the anterior compartment fascia. Bovie electrocautery was then used to elevated the IT band and musculature off of the anterolateral cortex of the tibia. The release was taken back until the fibular head was palpable. The plane between the IT band and the lateral capsule was developed and the anterior fat pad was resected to expose the capsule.  A submensical arthrotomy was then performed with a 15 blade. Tag sutures were used to retract the capsule and here I was able to visualize the impacted lateral joint and was able to see the meniscus. There was a peripheral tear of the lateral meniscus with full separation from the meniscocapsular junction at the anterior portion of meniscus, the posterior horn was not torn. Number 2 Fiberwire was used to throw horizontal mattress sutures through the capsule and into the body of the meniscus. A total of 2 sutures were thrown and these were tagged with hemostats.  There was a split in the anterolateral cortex that was entered into with a Cobb elevator. The clot was debrided and bone tamp was used to start elevating the articular surface of the lateral tibial plateau. This was done with assistance by fluoroscopy in both the lateral and AP. Once I had elevated the joint sufficiently by visualizing the joint, I used crushed cancellous allograft to back filled the void left. I packed the graft underneath the articular surface to provide some structural support. I used a number of anterior to posterior directed K-wires to hold the reduction of the disimpaction.  A 3.785mm LCP proximal tibial locking plate was chosen and was aligned to the tibia in both the AP and lateral  fluoroscopic views. A peri-articular reduction clamp was then used to further compress the plate to the lateral plateau  A non-locking buttress screw was placed in the proximal segment to bring the plate flush with bone. Four locking screws were then drilled and placed crossing both the lateral and medial plateau, thereby rafting the impacted lateral joint. Two cortical nonlocking screws were placed in the shaft to bring the plate flush to bone and complete the construct. These were placed through percutaneous incisions.  The tourniquet was dropped and hemostasis was obtained. The incision was thoroughly irrigated. The tag sutures for the capsule were brought through the plate and tied down and the meniscus repair sutures were brought through the IT band. One gram of vancomycin powder was placed in the wound and the IT band and anterior tibialis fascia was closed with #1 vicryl suture. The skin was closed with 2-0 vicryl and 3-0 nylon. The percutaneous incisions were closed with 3-0 nylon suture.  The incisions were dressed with bacitracin ointment, adaptic and 4x4s. The knee and leg were dressed with sterile cast padding and an ACE wrap and was placed back in a knee immobilizer. The patient was then awoken from anesthesia and taken to PACU in stable condition.  Post Op Plan/Instructions: The patient will be nonweightbearing. She will received postoperative Ancef and will received Lovenox for VTE prophylaxis while in the  hospital. She will be discharge on aspirin.  I was present and performed the entire surgery.  Montez Morita, PA-C did assist me throughout the case. An assistant was necessary given the difficulty in approach, maintenance of reduction and ability to instrument the fracture.   Truitt Merle, MD [facility]

## 2017-05-05 NOTE — Anesthesia Preprocedure Evaluation (Signed)
Anesthesia Evaluation  Patient identified by MRN, date of birth, ID band Patient awake    Reviewed: Allergy & Precautions, NPO status , Patient's Chart, lab work & pertinent test results  Airway Mallampati: I       Dental no notable dental hx. (+) Teeth Intact   Pulmonary Current Smoker,    Pulmonary exam normal breath sounds clear to auscultation       Cardiovascular negative cardio ROS Normal cardiovascular exam Rhythm:Regular Rate:Normal     Neuro/Psych PSYCHIATRIC DISORDERS Anxiety Depression negative neurological ROS     GI/Hepatic Neg liver ROS, GERD  Medicated,  Endo/Other    Renal/GU negative Renal ROS  negative genitourinary   Musculoskeletal negative musculoskeletal ROS (+)   Abdominal Normal abdominal exam  (+)   Peds  Hematology  (+) Blood dyscrasia, anemia ,   Anesthesia Other Findings   Reproductive/Obstetrics                             Anesthesia Physical Anesthesia Plan  ASA: II  Anesthesia Plan: General   Post-op Pain Management:    Induction: Intravenous  PONV Risk Score and Plan: 3 and Ondansetron and Dexamethasone  Airway Management Planned: Oral ETT  Additional Equipment:   Intra-op Plan:   Post-operative Plan: Extubation in OR  Informed Consent: I have reviewed the patients History and Physical, chart, labs and discussed the procedure including the risks, benefits and alternatives for the proposed anesthesia with the patient or authorized representative who has indicated his/her understanding and acceptance.   Dental advisory given  Plan Discussed with: CRNA and Surgeon  Anesthesia Plan Comments:         Anesthesia Quick Evaluation

## 2017-05-05 NOTE — Transfer of Care (Signed)
Immediate Anesthesia Transfer of Care Note  Patient: Amber Haley  Procedure(s) Performed: OPEN REDUCTION INTERNAL FIXATION (ORIF) TIBIAL PLATEAU (Left Knee)  Patient Location: PACU  Anesthesia Type:General  Level of Consciousness: awake, alert , oriented and patient cooperative  Airway & Oxygen Therapy: Patient Spontanous Breathing and Patient connected to face mask oxygen  Post-op Assessment: Report given to RN, Post -op Vital signs reviewed and stable, Patient moving all extremities and Patient moving all extremities X 4  Post vital signs: Reviewed and stable  Last Vitals:  Vitals Value Taken Time  BP 119/72 05/05/2017  3:31 PM  Temp 36.6 C 05/05/2017  3:30 PM  Pulse 89 05/05/2017  3:39 PM  Resp 11 05/05/2017  3:39 PM  SpO2 97 % 05/05/2017  3:39 PM  Vitals shown include unvalidated device data.  Last Pain:  Vitals:   05/05/17 1530  TempSrc:   PainSc: 8          Complications: No apparent anesthesia complications

## 2017-05-05 NOTE — Progress Notes (Signed)
RN called MD because pt complaining of 10/10 pain despite morphine administration and 15 mg of oxy. MD gave RN verbal order for one time dose 2mg  of dilaudid to hold her til time for oxy.

## 2017-05-05 NOTE — Progress Notes (Signed)
Orthopedic Trauma Service Progress Note   Patient ID: Amber Haley MRN: [id number] DOB/AGE: [birth year] 52 y.o.  Subjective:   Doing well Pain controlled Ready to get surgery over with Got to chair over weekend and ambulated in hallway  Pt states she has all equipment at home already including 3-1 commode, WC, walker  No complaints  Pain meds working   ROS As above  Objective:   VITALS:   Vitals:   05/04/17 0525 05/04/17 1421 05/04/17 2134 05/05/17 0500  BP: 114/62 117/67 140/84 113/65  Pulse: 61 72 83 61  Resp: 16 16 16    Temp: 97.8 F (36.6 C) (!) 97.4 F (36.3 C) 98.1 F (36.7 C) 97.9 F (36.6 C)  TempSrc: Oral Oral Oral Oral  SpO2: 96% 98% 93% 96%  Weight:      Height:        Estimated body mass index is 24.02 kg/m as calculated from the following:   Height as of this encounter: 5\' 8"  (1.727 m).   Weight as of this encounter: 71.7 kg (158 lb).   Intake/Output      04/21 0701 - 04/22 0700 04/22 0701 - 04/23 0700   P.O. 480    Total Intake(mL/kg) 480 (6.7)    Net +480           LABS  Results for orders placed or performed during the hospital encounter of 05/01/17 (from the past 24 hour(s))  Surgical pcr screen     Status: None   Collection Time: 05/04/17 12:14 PM  Result Value Ref Range   MRSA, PCR NEGATIVE NEGATIVE   Staphylococcus aureus NEGATIVE NEGATIVE  CBC     Status: Abnormal   Collection Time: 05/05/17  9:55 AM  Result Value Ref Range   WBC 7.5 4.0 - 10.5 K/uL   RBC 3.42 (L) 3.87 - 5.11 MIL/uL   Hemoglobin 10.6 (L) 12.0 - 15.0 g/dL   HCT 13.2 (L) 44.0 - 10.2 %   MCV 92.4 78.0 - 100.0 fL   MCH 31.0 26.0 - 34.0 pg   MCHC 33.5 30.0 - 36.0 g/dL   RDW 72.5 36.6 - 44.0 %   Platelets 279 150 - 400 K/uL  Protime-INR     Status: None   Collection Time: 05/05/17  9:55 AM  Result Value Ref Range   Prothrombin Time 12.0 11.4 - 15.2 seconds   INR 0.90   Type and screen  [facility]     Status: None  (Preliminary result)   Collection Time: 05/05/17 10:30 AM  Result Value Ref Range   ABO/RH(D) A POS    Antibody Screen PENDING    Sample Expiration      05/08/2017 Performed at [facility], [address] [place], [place] [address]      PHYSICAL EXAM:   Gen: resting comfortably in bed, NAD, appears well  Lungs: breathing unlabored Cardiac: Regular Abd: NT, soft  Ext:     Left Lower Extremity   Bulky dressing intact  Split lateral portion of dressing   Skin looks good   Ecchymosis noted   Swelling very well controlled   Skin wrinkles with gentle compression over anticipated surgical area  Distal motor and sensory functions intact  Ext warm   + DP pulse  Compartments soft, no pain with passive stretch     Assessment/Plan:     Principal Problem:   Closed fracture of lateral portion of left tibial plateau Active Problems:   Nicotine dependence  Anti-infectives (From admission, onward)   Start     Dose/Rate Route Frequency Ordered Stop   05/05/17 1000  ceFAZolin (ANCEF) IVPB 1 g/50 mL premix     1 g 100 mL/hr over 30 Minutes Intravenous To Surgery 05/05/17 0946 05/06/17 1000    .  POD/HD#: 484  52 y/o white female the L tibial plateau fracture   - L split depressed lateral tibial plateau fracture (shatzker 2)  OR today for ORIF  NWB x 6 weeks  Unrestricted ROM post op   Likely dc on 05/07/2017  Resume PT/OT post op     - Pain management:  Continue with current regimen  Will likely place on PCA post op for 24 hours   - ABL anemia/Hemodynamics  Labs pending from this am   - Medical issues   Nicotine dependence   Discussed importance of smoking cessation with respect to bone and wound healing   - DVT/PE prophylaxis:  lovenox   Currently on hold for surgery   30 days post op   - ID:   periop abx  - Metabolic Bone Disease:  Labs pending   - Activity:  NWB L leg   - FEN/GI prophylaxis/Foley/Lines:  NPO   - Impediments to fracture  healing:  Nicotine use   Suspect some degree of osteoporosis/osteopenia    - Dispo:  OR today for ORIF L tibial plateau     Mearl LatinKeith W. Detta Mellin, PA-C [facility] [phone] (P) [phone] (O) [phone] (C) 05/05/2017, 11:08 AM

## 2017-05-05 NOTE — Anesthesia Procedure Notes (Signed)
Procedure Name: Intubation Date/Time: 05/05/2017 12:48 PM Performed by: Coralee RudFlores, Briston Lax, CRNA Pre-anesthesia Checklist: Patient identified, Suction available, Emergency Drugs available and Patient being monitored Patient Re-evaluated:Patient Re-evaluated prior to induction Oxygen Delivery Method: Circle system utilized Preoxygenation: Pre-oxygenation with 100% oxygen Induction Type: IV induction Ventilation: Mask ventilation without difficulty Laryngoscope Size: Miller and 3 Grade View: Grade I Tube type: Oral Tube size: 7.5 mm Airway Equipment and Method: Stylet Placement Confirmation: ETT inserted through vocal cords under direct vision,  positive ETCO2 and breath sounds checked- equal and bilateral Secured at: 22 cm Tube secured with: Tape Dental Injury: Teeth and Oropharynx as per pre-operative assessment

## 2017-05-06 ENCOUNTER — Encounter (HOSPITAL_COMMUNITY): Payer: Self-pay | Admitting: Student

## 2017-05-06 LAB — CBC
HEMATOCRIT: 26.8 % — AB (ref 36.0–46.0)
Hemoglobin: 8.8 g/dL — ABNORMAL LOW (ref 12.0–15.0)
MCH: 30.8 pg (ref 26.0–34.0)
MCHC: 32.8 g/dL (ref 30.0–36.0)
MCV: 93.7 fL (ref 78.0–100.0)
PLATELETS: 261 10*3/uL (ref 150–400)
RBC: 2.86 MIL/uL — ABNORMAL LOW (ref 3.87–5.11)
RDW: 12.4 % (ref 11.5–15.5)
WBC: 6.9 10*3/uL (ref 4.0–10.5)

## 2017-05-06 MED ORDER — CALCIUM CITRATE 950 (200 CA) MG PO TABS
200.0000 mg | ORAL_TABLET | Freq: Two times a day (BID) | ORAL | Status: DC
  Administered 2017-05-06 – 2017-05-07 (×3): 200 mg via ORAL
  Filled 2017-05-06 (×3): qty 1

## 2017-05-06 MED ORDER — VITAMIN C 500 MG PO TABS
500.0000 mg | ORAL_TABLET | Freq: Every day | ORAL | Status: DC
Start: 2017-05-06 — End: 2017-05-07
  Administered 2017-05-06 – 2017-05-07 (×2): 500 mg via ORAL
  Filled 2017-05-06 (×2): qty 1

## 2017-05-06 MED ORDER — KETOROLAC TROMETHAMINE 30 MG/ML IJ SOLN
30.0000 mg | Freq: Once | INTRAMUSCULAR | Status: AC
  Administered 2017-05-06: 30 mg via INTRAVENOUS
  Filled 2017-05-06: qty 1

## 2017-05-06 MED ORDER — GABAPENTIN 300 MG PO CAPS
300.0000 mg | ORAL_CAPSULE | Freq: Two times a day (BID) | ORAL | Status: DC
  Administered 2017-05-06 – 2017-05-07 (×3): 300 mg via ORAL
  Filled 2017-05-06 (×3): qty 1

## 2017-05-06 MED ORDER — VITAMIN D 1000 UNITS PO TABS
2000.0000 [IU] | ORAL_TABLET | Freq: Two times a day (BID) | ORAL | Status: DC
  Administered 2017-05-06 – 2017-05-07 (×3): 2000 [IU] via ORAL
  Filled 2017-05-06 (×4): qty 2

## 2017-05-06 NOTE — Progress Notes (Signed)
Orthopedic Tech Progress Note Patient Details:  Amber Haley 05/23/1965 [id number]  Ortho Devices Type of Ortho Device: Bone foam zero knee Ortho Device/Splint Location: lle Ortho Device/Splint Interventions: Application   Post Interventions Patient Tolerated: Well Instructions Provided: Care of device   [name], Chantrice Hagg 05/06/2017, 10:51 AM

## 2017-05-06 NOTE — Progress Notes (Signed)
Orthopedic Trauma Service Progress Note   Patient ID: Amber Haley MRN: [id number] DOB/AGE: August 17, 1965 52 y.o.  Subjective:  Doing well this am Had a rough evening after surgery but much better now Left leg is sore but not too bad   Mobilizing very well already Was up in the bathroom washing up when I entered No complaints   Pt was told that she could only have 2 pillows as we are at max capacity in the hospital    Will order bone foam to help with leg elevation and knee extension   Husband works in [employer] and is out of town 4 days a week Hopeful to get a aide for those 4 days to help pt at home    Review of Systems  Constitutional: Negative for chills and fever.  Respiratory: Negative for shortness of breath.   Cardiovascular: Negative for chest pain and palpitations.  Gastrointestinal: Negative for nausea and vomiting.  Genitourinary: Negative for dysuria and urgency.  Neurological: Negative for dizziness, tingling, tremors and headaches.    Objective:   VITALS:   Vitals:   05/05/17 2145 05/06/17 0004 05/06/17 0631 05/06/17 0800  BP: 120/70 118/69 113/73 115/72  Pulse: 94 92 79 87  Resp: 16 16 18 18   Temp: 98.2 F (36.8 C) 98.3 F (36.8 C)    TempSrc: Oral Oral    SpO2: 93% 95% 90% 94%  Weight:      Height:        Estimated body mass index is 24.03 kg/m as calculated from the following:   Height as of this encounter: 5' 7.99" (1.727 m).   Weight as of this encounter: 71.7 kg (158 lb).   Intake/Output      04/22 0701 - 04/23 0700 04/23 0701 - 04/24 0700   P.O.  240   I.V. (mL/kg) 1664.7 (23.2)    IV Piggyback 100    Total Intake(mL/kg) 1764.7 (24.6) 240 (3.3)   Blood 100    Total Output 100    Net +1664.7 +240        Urine Occurrence 1 x 1 x     LABS  Results for orders placed or performed during the hospital encounter of 05/01/17 (from the past 24 hour(s))  Type and screen [facility]     Status: None   Collection Time: 05/05/17 10:30 AM  Result Value Ref Range   ABO/RH(D) A POS    Antibody Screen NEG    Sample Expiration      05/08/2017 Performed at [facility], [address] [place], [place] [address]   ABO/Rh     Status: None   Collection Time: 05/05/17 10:30 AM  Result Value Ref Range   ABO/RH(D)      A POS Performed at [facility], [address], [place], [place] [address]   CBC     Status: Abnormal   Collection Time: 05/06/17  6:41 AM  Result Value Ref Range   WBC 6.9 4.0 - 10.5 K/uL   RBC 2.86 (L) 3.87 - 5.11 MIL/uL   Hemoglobin 8.8 (L) 12.0 - 15.0 g/dL   HCT 82.9 (L) 56.2 - 13.0 %   MCV 93.7 78.0 - 100.0 fL   MCH 30.8 26.0 - 34.0 pg   MCHC 32.8 30.0 - 36.0 g/dL   RDW 86.5 78.4 - 69.6 %   Platelets 261 150 - 400 K/uL    Results for Mcray, MERRELL RETTINGER (MRN [id number]) as of 05/06/2017  10:31  Ref. Range 05/03/2017 07:09  Vit D, 1,25-Dihydroxy Latest Ref Range: 19.9 - 79.3 pg/mL 44.2  Vitamin D, 25-Hydroxy Latest Ref Range: 30.0 - 100.0 ng/mL 19.8 (L)   PHYSICAL EXAM:   Gen: awake,alert, NAD, appears very well, in good spirits  Lungs: breathing unlabored Cardiac: RRR Abd: NTND Ext:       Left Lower Extremity   Dressing c/d/i  DPN, SPN, TN sensation intact  EHL, FHL, AT, PT, peroneals, gastroc motor intact  + quad set  Ext warm   + DP pulse  No pain with passive stretch   Compartments are soft  Stable ecchymosis to L leg, including thigh   Assessment/Plan: 1 Day Post-Op   Principal Problem:   Closed fracture of lateral portion of left tibial plateau Active Problems:   Nicotine dependence   Anti-infectives (From admission, onward)   Start     Dose/Rate Route Frequency Ordered Stop   05/05/17 2100  ceFAZolin (ANCEF) IVPB 2g/100 mL premix     2 g 200 mL/hr over 30 Minutes Intravenous Every 8 hours 05/05/17 1620 05/06/17 2159   05/05/17 1516  vancomycin (VANCOCIN) powder  Status:  Discontinued       As needed 05/05/17 1516 05/05/17 1525    05/05/17 1441  vancomycin (VANCOCIN) powder  Status:  Discontinued       As needed 05/05/17 1441 05/05/17 1525   05/05/17 1000  ceFAZolin (ANCEF) IVPB 1 g/50 mL premix     1 g 100 mL/hr over 30 Minutes Intravenous To Surgery 05/05/17 0946 05/06/17 1000    .  POD/HD#: 1    53 y/o white female the L tibial plateau fracture    - L split depressed lateral tibial plateau fracture (shatzker 2) s/p ORIF             NWB x 6 weeks  Unrestricted ROM L knee   Brace locked in full extension for sleep    Will get bone foam to assist with elevation of L leg and maximize knee extension at rest   When pt is in chair L knee is to be in full extension or dangling down. Do not leg pt rest with knee on leg rest partially bet. This is to help prevent flexion contracture    PT/OT evals   HHPT with quick transition to outpt therapy     HEP for L knee ROM- AROM, PROM. Prone exercises as well. No ROM restrictions. Quad sets, SLR, LAQ, SAQ, heel slides, stretching, prone flexion and extension    Ankle theraband program, heel cord stretching, toe towel curls, etc     - Pain management:            scheduled tylenol, toradol, robaxin   Added neurontin today 300 mg po q12h scheduled  Oxy IR prn   Minimize use of IV pain meds   IV only to be used after all PO meds have been given and pt still having pain    - ABL anemia/Hemodynamics            CBC in am   H/H drifting down, not unexpected  Hemodynamics stable   Monitor    - Medical issues              Nicotine dependence                         Discussed importance of smoking cessation with respect to bone and wound healing    -  DVT/PE prophylaxis:             lovenox while inpatient    - ID:              periop abx to be completed today    - Metabolic Bone Disease:             + vitamin d deficiency o/w not other significant findings   Supplement with 4000-5000 IU vitamin d3 daily    Calcium citrate BID    - Activity:             NWB L leg     - FEN/GI prophylaxis/Foley/Lines:            reg diet  Bowel regimen     - Impediments to fracture healing:             Nicotine use              Suspect some degree of osteoporosis/osteopenia    Calcium and vitamin d supplementation   Encourage WB exercises once able to do so to maximize bone strength               - Dispo:             PT/OT  Dressing change tomorrow   Likely dc home tomorrow    Mearl LatinKeith W. Jaquana Geiger, PA-C [facility] [phone] (P) [phone] Traci Sermon(O) [phone] (C) 05/06/2017, 10:28 AM

## 2017-05-06 NOTE — Care Management Note (Signed)
Case Management Note  Patient Details  Name: Amber Haley MRN: [id number] Date of Birth: 07/10/1965  Subjective/Objective:  [age] yr old femla es/p injury at work resulting in left Tibial plateau fracture, and meniscus tear. Patient underwent ORIF of left tibial plateau fracture, and repair of lateral meniscus tear.                   Action/Plan: Case manager spoke with patient concerning discharge plan and DME. She and her husband say they are borrowing RW, wheelchair and 3in1 from her sister. Patient has concerns about being home while husband is away for work. She also expresses concerns about being able to get to and from her followup doctor appointments. Case manager has left message for patient's worker's comp adjuster: Cami Dareen Pianonderson with [employer], [phone], will fax orders to her at [phone]. Patients claim # is [id number].   Expected Discharge Date:    05/07/17              Expected Discharge Plan:  Home w Home Health Services  In-House Referral:  NA  Discharge planning Services  CM Consult  Post Acute Care Choice:  Home Health Choice offered to:  Patient  DME Arranged:  N/A(borrowing RW,3in1, wheelchair) DME Agency:  NA  HH Arranged:  PT, NA HH Agency:  (to be determined by worker's Comp)  Status of Service:  In process, will continue to follow  If discussed at Long Length of Stay Meetings, dates discussed:    Additional Comments:  Amber GuthrieBrady, Amber Loving Naomi, RN 05/06/2017, 11:20 AM

## 2017-05-06 NOTE — Progress Notes (Signed)
Occupational Therapy Treatment Patient Details Name: Amber Haley MRN: [id number] DOB: [birth year] Today's Date: 05/06/2017    History of present illness 52 yo female s/p LLE NWB x6-8 weeks due to comminuted left lateral split/depression tibial plateau fracture with significant joint depression. S/p surgery 05/05/17. Pt s/p ORIF of L tib fracture on 05/05/17. [id number], anxiety   OT comments  Pt is at adequate level with spouse (A) to d/c home from OT standpoint. Pt and spouse again talking about the pool and ability to get into the water. Pt advised that getting out of the pool is of concern if patient is NWB and to follow the advice of the MD regarding the pool. Pt currently in chair with RN arriving for IV medicaiton.   Follow Up Recommendations  No OT follow up   Equipment Recommendations  3 in 1 bedside commode;Other (comment)    Recommendations for Other Services      Precautions / Restrictions Precautions Precautions: Fall Required Braces or Orthoses: Other Brace/Splint Other Brace/Splint: knee brace, unlocked- full ROM allowed at this time Restrictions LLE Weight Bearing: Non weight bearing       Mobility Bed Mobility Overal bed mobility: Independent             General bed mobility comments: oob in the chair at this time  Transfers Overall transfer level: Needs assistance Equipment used: Rolling walker (2 wheeled) Transfers: Sit to/from Stand Sit to Stand: Supervision              Balance Overall balance assessment: Modified Independent;Mild deficits observed, not formally tested                                         ADL either performed or assessed with clinical judgement   ADL Overall ADL's : Needs assistance/impaired                     Lower Body Dressing: Modified independent Lower Body Dressing Details (indicate cue type and reason): pt able to reach feet   Toilet Transfer Details (indicate cue type and reason):  reports going to and from bathroom with spouse so they are mod I with this task. RN staff confirmes       Tub/Shower Transfer Details (indicate cue type and reason): sister does have shower seat and pt able to verbalize information back to therapist from previous session   General ADL Comments: pt demonstrates don/ doff of brace.      Vision       Perception     Praxis      Cognition Arousal/Alertness: Awake/alert Behavior During Therapy: WFL for tasks assessed/performed Overall Cognitive Status: Within Functional Limits for tasks assessed                                          Exercises     Shoulder Instructions       General Comments educated on washing around wound and adjustment to hte brace as ace wrap is removed. Brace appears slight lower than knee alignment    Pertinent Vitals/ Pain       Pain Assessment: No/denies pain Pain Score: 7  Pain Location: L LE Pain Descriptors / Indicators: Discomfort;Operative site guarding;Sore Pain Intervention(s): Limited activity within patient's tolerance;Monitored during session;Premedicated before  session;Patient requesting pain meds-RN notified;Repositioned  Home Living                                          Prior Functioning/Environment              Frequency  Min 2X/week        Progress Toward Goals  OT Goals(current goals can now be found in the care plan section)  Progress towards OT goals: Goals met/education completed, patient discharged from OT  Acute Rehab OT Goals Patient Stated Goal: to return home to animals/ go on vacation OT Goal Formulation: With patient Time For Goal Achievement: 05/17/17 Potential to Achieve Goals: Good ADL Goals Pt Will Perform Lower Body Dressing: with supervision Pt Will Transfer to Toilet: with supervision Pt Will Perform Tub/Shower Transfer: Shower transfer;with supervision;ambulating;shower seat  Plan All goals met and  education completed, patient discharged from OT services    Co-evaluation                 AM-PAC PT "6 Clicks" Daily Activity     Outcome Measure   Help from another person eating meals?: None Help from another person taking care of personal grooming?: None Help from another person toileting, which includes using toliet, bedpan, or urinal?: None Help from another person bathing (including washing, rinsing, drying)?: None Help from another person to put on and taking off regular upper body clothing?: None Help from another person to put on and taking off regular lower body clothing?: None 6 Click Score: 24    End of Session    OT Visit Diagnosis: Unsteadiness on feet (R26.81)   Activity Tolerance Patient tolerated treatment well   Patient Left in chair   Nurse Communication Mobility status        Time: 1435-1445 OT Time Calculation (min): 10 min  Charges: OT General Charges $OT Visit: 1 Visit OT Treatments $Self Care/Home Management : 8-22 mins   Jeri Modena   OTR/L Pager: [phone] Office: [phone] .    Parke Poisson B 05/06/2017, 3:58 PM

## 2017-05-06 NOTE — Progress Notes (Signed)
Physical Therapy Treatment Patient Details Name: Amber Haley MRN: [id number] DOB: [birth year] Today's Date: 05/06/2017    History of Present Illness 52 yo female s/p LLE NWB x6-8 weeks due to comminuted left lateral split/depression tibial plateau fracture with significant joint depression. S/p surgery 05/05/17. Pt s/p ORIF of L tib fracture on 05/05/17. WUJ:[id number], anxiety    PT Comments    Pt progressing well in general, has been AMB in room freely to perform ADL and self care with good observance of NWB LLE. Pt reports pain control remain difficult at this time, but is able to perform full HEP program without significant restriction. Extensive HEP established with patient as recommended by surgical team, with some modification to make program appropriate to the patient's current level of function, tolerance, and independence. Ankle resistance band inversion/eversion not successful due to leg cross needed to perform, eliciting significant discomfort in knee, likely correlated to torsional stress in the joint from leg crossing. Pt reports good activation with band dorsiflexion/plantarflexion. Prone exercises are moved to standing with success. ROM limited d/t pain but 12-70 degrees with moderate effort (not pushed as pt awaiting pain meds). Will continue to follow acutely.        Follow Up Recommendations  Home health PT;Supervision for mobility/OOB     Equipment Recommendations  3in1 (PT)    Recommendations for Other Services       Precautions / Restrictions Precautions Precautions: Fall Required Braces or Orthoses: Other Brace/Splint Other Brace/Splint: knee brace, unlocked- full ROM allowed at this time Restrictions Weight Bearing Restrictions: Yes LLE Weight Bearing: Non weight bearing    Mobility  Bed Mobility Overal bed mobility: Independent                Transfers Overall transfer level: Needs assistance Equipment used: Rolling walker (2 wheeled) Transfers: Sit  to/from Stand Sit to Stand: Supervision            Ambulation/Gait Ambulation/Gait assistance: (bed to chair only this session, pt has been AMB in room freely. ) Ambulation Distance (Feet): 20 Feet Assistive device: Rolling walker (2 wheeled)       General Gait Details: Hop-to pattern, NWB on LLE    Stairs             Wheelchair Mobility    Modified Rankin (Stroke Patients Only)       Balance Overall balance assessment: Modified Independent;Mild deficits observed, not formally tested                                          Cognition Arousal/Alertness: Awake/alert Behavior During Therapy: WFL for tasks assessed/performed Overall Cognitive Status: Within Functional Limits for tasks assessed                                        Exercises   All performed in supine on the operative side unless otherwise indicated:   -Hip Abduction AROM: 15x -Hip Adduction AROM: 15x -Heel slides, AA/ROM: 15x, taught self assist with blanket -Minisquat/Quad set A/ROM: 15x -Short Arc Quads AA/ROM: 10x  -Straight Leg Raise AA/ROM: 10x, taught self assist with blanket  -Standing Left hip extension 10x3secH -Standing Left hip abduction 10x3secH -Long sitting Left ankle t-band eversion 1x10  -Long sitting Left ankle t-band inversion 1x10  -Seated Left ankle circles:  20xCW, 20xCCW  -Long sitting Calf stretch with belt: 1x30sec  -Long sitting ankle Plantar flexion with band 1x15 -seated left ankle dorsiflexion with band: 1x10x3secH        General Comments        Pertinent Vitals/Pain Pain Assessment: 0-10 Pain Score: 7  Pain Location: L LE Pain Descriptors / Indicators: Discomfort;Operative site guarding;Sore Pain Intervention(s): Limited activity within patient's tolerance;Monitored during session;Premedicated before session;Patient requesting pain meds-RN notified;Repositioned    Home Living                      Prior  Function            PT Goals (current goals can now be found in the care plan section) Acute Rehab PT Goals Patient Stated Goal: to return home to animals/ go on vacation PT Goal Formulation: With patient Time For Goal Achievement: 05/10/17 Potential to Achieve Goals: Good Progress towards PT goals: Progressing toward goals    Frequency    Min 5X/week      PT Plan Current plan remains appropriate    Co-evaluation              AM-PAC PT "6 Clicks" Daily Activity  Outcome Measure  Difficulty turning over in bed (including adjusting bedclothes, sheets and blankets)?: A Little Difficulty moving from lying on back to sitting on the side of the bed? : A Little Difficulty sitting down on and standing up from a chair with arms (e.g., wheelchair, bedside commode, etc,.)?: A Little Help needed moving to and from a bed to chair (including a wheelchair)?: A Little Help needed walking in hospital room?: A Little Help needed climbing 3-5 steps with a railing? : A Little 6 Click Score: 18    End of Session Equipment Utilized During Treatment: Gait belt Activity Tolerance: Patient tolerated treatment well Patient left: with call bell/phone within reach;in chair;with family/visitor present Nurse Communication: Mobility status PT Visit Diagnosis: Unsteadiness on feet (R26.81);Other abnormalities of gait and mobility (R26.89);Muscle weakness (generalized) (M62.81);Pain Pain - Right/Left: Left Pain - part of body: Leg     Time: [id number] PT Time Calculation (min) (ACUTE ONLY): 55 min  Charges:  $Therapeutic Exercise: 38-52 mins $Therapeutic Activity: 8-22 mins                    G Codes:       2:25 PM, 05/06/17 Rosamaria LintsAllan C Izrael Peak, PT, DPT Physical Therapist - [place] [phone] (Pager)  [phone] (Office)      Valon Glasscock C 05/06/2017, 2:15 PM

## 2017-05-06 NOTE — Progress Notes (Signed)
Orthopedic Tech Progress Note Patient Details:  Amber Haley 08/25/1965 [id number]  Patient ID: Amber Haley, female   DOB: 11/19/1965, 52 y.o.   MRN: [id number]   [name], Salam Micucci 05/06/2017, 9:29 AM Called in bio-tech brace order; spoke with Wylene MenLacey

## 2017-05-07 ENCOUNTER — Ambulatory Visit (HOSPITAL_COMMUNITY): Payer: Self-pay

## 2017-05-07 ENCOUNTER — Other Ambulatory Visit (HOSPITAL_COMMUNITY): Payer: Self-pay | Admitting: Student

## 2017-05-07 ENCOUNTER — Ambulatory Visit (HOSPITAL_COMMUNITY): Admission: RE | Admit: 2017-05-07 | Payer: Self-pay | Source: Ambulatory Visit

## 2017-05-07 ENCOUNTER — Ambulatory Visit (HOSPITAL_COMMUNITY)
Admission: RE | Admit: 2017-05-07 | Discharge: 2017-05-07 | Disposition: A | Discharge status: Home / Self Care | Payer: Worker's Compensation | Source: Ambulatory Visit | Attending: Student | Admitting: Student

## 2017-05-07 DIAGNOSIS — Z419 Encounter for procedure for purposes other than remedying health state, unspecified: Secondary | ICD-10-CM

## 2017-05-07 LAB — CBC
HCT: 27.1 % — ABNORMAL LOW (ref 36.0–46.0)
Hemoglobin: 8.9 g/dL — ABNORMAL LOW (ref 12.0–15.0)
MCH: 30.8 pg (ref 26.0–34.0)
MCHC: 32.8 g/dL (ref 30.0–36.0)
MCV: 93.8 fL (ref 78.0–100.0)
Platelets: 268 10*3/uL (ref 150–400)
RBC: 2.89 MIL/uL — AB (ref 3.87–5.11)
RDW: 12.7 % (ref 11.5–15.5)
WBC: 6.4 10*3/uL (ref 4.0–10.5)

## 2017-05-07 LAB — NICOTINE/COTININE METABOLITES
COTININE: 18.5 ng/mL
NICOTINE: NOT DETECTED ng/mL

## 2017-05-07 MED ORDER — ASCORBIC ACID 500 MG PO TABS: 500.0000 mg | ORAL_TABLET | Freq: Every day | ORAL | 0 refills | Status: AC

## 2017-05-07 MED ORDER — OXYCODONE-ACETAMINOPHEN 5-325 MG PO TABS: 1.0000 | ORAL_TABLET | ORAL | 0 refills | Status: DC | PRN

## 2017-05-07 MED ORDER — CHOLECALCIFEROL 50 MCG (2000 UT) PO TABS: 2000.0000 [IU] | ORAL_TABLET | Freq: Two times a day (BID) | ORAL | 1 refills | Status: AC

## 2017-05-07 MED ORDER — METHOCARBAMOL 750 MG PO TABS: 750.0000 mg | ORAL_TABLET | Freq: Four times a day (QID) | ORAL | 0 refills | Status: DC

## 2017-05-07 MED ORDER — ASPIRIN EC 325 MG PO TBEC: 325.0000 mg | DELAYED_RELEASE_TABLET | Freq: Every day | ORAL | 0 refills | Status: AC

## 2017-05-07 NOTE — Progress Notes (Signed)
RN gave pt discharge instruction spt stated understanding and did not have any further questions. comfortable awaiting volunteers

## 2017-05-07 NOTE — Discharge Summary (Signed)
[facility] (OT[facility])  Patient ID: Amber Haley MRN: [id number] DOB/AGE: 07-09-1965 51 y.o.  Admit date: 05/01/2017 Discharge date: 05/07/2017  Admission Diagnoses:Principal Problem:   Closed fracture of lateral portion of left tibial plateau   Nicotine dependence  Discharge Diagnoses:  Principal Problem:   Closed fracture of lateral portion of left tibial plateau Active Problems:   Nicotine dependence   Past Medical History:  Diagnosis Date  . Anxiety   . GERD (gastroesophageal reflux disease)   . Nicotine dependence 05/05/2017  . Tibial plateau fracture, left 05/01/2017   severely comminuted left tibial plateau fracture occurring today when a stack of cases of orange juice fell onto her leg./notes 05/02/2017    Procedures Performed: 05/05/17: 1. CPT 27235-ORIF of left tibial plateau fracture 2. CPT 27403-Repair of lateral meniscus tear  Discharged Condition: good  Hospital Course: Patient was admitted from the emergency department.  She was kept over the weekend to mobilize with physical therapy.  Swelling was allowed to return to a appropriate level to pursue incision.  She underwent the above procedure.  She did well.  She will mobilize with physical therapy.  She was fitted for a hinged knee brace.  Her dressing was changed appropriate postoperative day 2.  Upon discharge on postoperative day 2 she was tolerating regular diet, voiding spontaneously and pain was well controlled with oral medication.  She was found fit for discharge home with home health therapy.  Consults: None  Significant Diagnostic Studies: None  Treatments: surgery: as above  Discharge Exam: general: No acute distress awake alert oriented x3 above Left lower extremity: Dressing taken down incision is clean dry and intact.  New dressing placed.  She is neurovascularly intact distally.  Range of motion is from 0 to almost 90 degrees.  Disposition: Discharge disposition: 01-Home or Self  Care        Allergies as of 05/07/2017   No Active Allergies     Medication List    STOP taking these medications   ibuprofen 600 MG tablet Commonly known as:  ADVIL,MOTRIN     TAKE these medications   ALIGN 4 MG Caps Take 4 mg by mouth daily.   ALPRAZolam 0.25 MG tablet Commonly known as:  XANAX Take 0.25 mg by mouth daily as needed for anxiety.   ascorbic acid 500 MG tablet Commonly known as:  VITAMIN C Take 1 tablet (500 mg total) by mouth daily.   aspirin EC 325 MG tablet Take 1 tablet (325 mg total) by mouth daily.   Cholecalciferol 2000 units Tabs Take 1 tablet (2,000 Units total) by mouth 2 (two) times daily.   methocarbamol 750 MG tablet Commonly known as:  ROBAXIN Take 1 tablet (750 mg total) by mouth 4 (four) times daily.   omeprazole 20 MG tablet Commonly known as:  PRILOSEC OTC Take 20 mg by mouth daily.   ONE-A-DAY WOMENS PO Take 1 tablet by mouth daily.   oxyCODONE-acetaminophen 5-325 MG tablet Commonly known as:  PERCOCET/ROXICET Take 1-2 tablets by mouth every 4 (four) hours as needed for severe pain. What changed:  when to take this   sertraline 100 MG tablet Commonly known as:  ZOLOFT Take 100 mg by mouth daily.   traZODone 50 MG tablet Commonly known as:  DESYREL Take 50 mg by mouth at bedtime as needed for sleep.      Follow-up Information    Pavle Wiler, Gillie Manners, MD. Schedule an appointment as soon as possible for a visit in 2  week(s).   Specialty:  Orthopedic Surgery Contact information: [address] [place] [address] [phone]        Worker's Comp Follow up.   Why:  You will receive a call from a representative with your worker's comp to notify you with name of Home Health Agency.           Discharge Instructions and Plan: Patient will be nonweightbearing to the left lower extremity.  She will be in a hinged knee brace locked in extension at night.  She received aspirin for DVT prophylaxis.  She will  return in 2 weeks for x-rays and suture removal.  Signed:  Roby LoftsKevin P. Katilin Raynes, MD [facility] [phone] (phone)  05/07/2017, 1:42 PM

## 2017-05-07 NOTE — Discharge Instructions (Signed)
[facility]uma [facility] Discharge Instructions   General Discharge Instructions  WEIGHT BEARING STATUS: Nonweightbearing  RANGE OF MOTION/ACTIVITY: Keep leg in brace at all time, except to shower. Keep brace locked in extension at night.  Wound Care: you may remove dressing tomorrow evening (Thursday) and then you may shower. Cover incisions with ACE wrap afterwards  DVT/PE prophylaxis: Aspirin 325mg  daily  Diet: as you were eating previously.  Can use over the counter stool softeners and bowel preparations, such as Miralax, to help with bowel movements.  Narcotics can be constipating.  Be sure to drink plenty of fluids  PAIN MEDICATION USE AND EXPECTATIONS  You have likely been given narcotic medications to help control your pain.  After a traumatic event that results in an fracture (broken bone) with or without surgery, it is ok to use narcotic pain medications to help control one's pain.  We understand that everyone responds to pain differently and each individual patient will be evaluated on a regular basis for the continued need for narcotic medications. Ideally, narcotic medication use should last no more than 6-8 weeks (coinciding with fracture healing).   As a patient it is your responsibility as well to monitor narcotic medication use and report the amount and frequency you use these medications when you come to your office visit.   We would also advise that if you are using narcotic medications, you should take a dose prior to therapy to maximize you participation.  IF YOU ARE ON NARCOTIC MEDICATIONS IT IS NOT PERMISSIBLE TO OPERATE A MOTOR VEHICLE (MOTORCYCLE/CAR/TRUCK/MOPED) OR HEAVY MACHINERY DO NOT MIX NARCOTICS WITH OTHER CNS (CENTRAL NERVOUS SYSTEM) DEPRESSANTS SUCH AS ALCOHOL   STOP SMOKING OR USING NICOTINE PRODUCTS!!!!  As discussed nicotine severely impairs your body's ability to heal surgical and traumatic wounds but also impairs bone healing.  Wounds and bone heal by  forming microscopic blood vessels (angiogenesis) and nicotine is a vasoconstrictor (essentially, shrinks blood vessels).  Therefore, if vasoconstriction occurs to these microscopic blood vessels they essentially disappear and are unable to deliver necessary nutrients to the healing tissue.  This is one modifiable factor that you can do to dramatically increase your chances of healing your injury.    (This means no smoking, no nicotine gum, patches, etc)  DO NOT USE NONSTEROIDAL ANTI-INFLAMMATORY DRUGS (NSAID'S)  Using products such as Advil (ibuprofen), Aleve (naproxen), Motrin (ibuprofen) for additional pain control during fracture healing can delay and/or prevent the healing response.  If you would like to take over the counter (OTC) medication, Tylenol (acetaminophen) is ok.  However, some narcotic medications that are given for pain control contain acetaminophen as well. Therefore, you should not exceed more than 4000 mg of tylenol in a day if you do not have liver disease.  Also note that there are may OTC medicines, such as cold medicines and allergy medicines that my contain tylenol as well.  If you have any questions about medications and/or interactions please ask your doctor/PA or your pharmacist.      ICE AND ELEVATE INJURED/OPERATIVE EXTREMITY  Using ice and elevating the injured extremity above your heart can help with swelling and pain control.  Icing in a pulsatile fashion, such as 20 minutes on and 20 minutes off, can be followed.    Do not place ice directly on skin. Make sure there is a barrier between to skin and the ice pack.    Using frozen items such as frozen peas works well as the conform nicely to the are  that needs to be iced.  USE AN ACE WRAP OR TED HOSE FOR SWELLING CONTROL  In addition to icing and elevation, Ace wraps or TED hose are used to help limit and resolve swelling.  It is recommended to use Ace wraps or TED hose until you are informed to stop.    When using Ace  Wraps start the wrapping distally (farthest away from the body) and wrap proximally (closer to the body)   Example: If you had surgery on your leg or thing and you do not have a splint on, start the ace wrap at the toes and work your way up to the thigh        If you had surgery on your upper extremity and do not have a splint on, start the ace wrap at your fingers and work your way up to the upper arm  IF YOU ARE IN A SPLINT OR CAST DO NOT REMOVE IT FOR ANY REASON   If your splint gets wet for any reason please contact the office immediately. You may shower in your splint or cast as long as you keep it dry.  This can be done by wrapping in a cast cover or garbage back (or similar)  Do Not stick any thing down your splint or cast such as pencils, money, or hangers to try and scratch yourself with.  If you feel itchy take benadryl as prescribed on the bottle for itching  IF YOU ARE IN A CAM BOOT (BLACK BOOT)  You may remove boot periodically. Perform daily dressing changes as noted below.  Wash the liner of the boot regularly and wear a sock when wearing the boot. It is recommended that you sleep in the boot until told otherwise  CALL THE OFFICE WITH ANY QUESTIONS OR CONCERNS: [phone]   Discharge Wound Care Instructions  Do NOT apply any ointments, solutions or lotions to pin sites or surgical wounds.  These prevent needed drainage and even though solutions like hydrogen peroxide kill bacteria, they also damage cells lining the pin sites that help fight infection.  Applying lotions or ointments can keep the wounds moist and can cause them to breakdown and open up as well. This can increase the risk for infection. When in doubt call the office.  Surgical incisions should be dressed daily.  If any drainage is noted, use one layer of adaptic, then gauze, Kerlix, and an ace wrap.

## 2017-05-13 NOTE — Anesthesia Postprocedure Evaluation (Addendum)
Anesthesia Post Note  Patient: Amber Haley  Procedure(s) Performed: OPEN REDUCTION INTERNAL FIXATION (ORIF) TIBIAL PLATEAU (Left Knee)     Patient location during evaluation: PACU Anesthesia Type: General Level of consciousness: awake Pain management: pain level controlled Vital Signs Assessment: post-procedure vital signs reviewed and stable Respiratory status: spontaneous breathing Cardiovascular status: stable Postop Assessment: no apparent nausea or vomiting Anesthetic complications: no    Last Vitals:  Vitals:   05/06/17 2135 05/07/17 0556  BP: 132/83 132/82  Pulse: 87 79  Resp: 17 18  Temp: 36.9 C 37.4 C  SpO2: 96% 96%    Last Pain:  Vitals:   05/07/17 0937  TempSrc:   PainSc: 5    Pain Goal: Patients Stated Pain Goal: 4 (05/05/17 1540)               Sanita Estrada JR,JOHN Susann Givens

## 2017-12-08 ENCOUNTER — Other Ambulatory Visit: Payer: Self-pay | Admitting: Orthopedic Surgery

## 2017-12-09 NOTE — Patient Instructions (Addendum)
Amber FloorLisa A Broadfoot  12/09/2017   Your procedure is scheduled on: 12-22-17  [facility]  Report to admitting at    0630 AM    Call this number if you have problems the morning of surgery [phone]    Remember: Do not eat food or drink liquids :After Midnight.   BRUSH YOUR TEETH MORNING OF SURGERY AND RINSE YOUR MOUTH OUT, NO CHEWING GUM CANDY OR MINTS.     Take these medicines the morning of surgery with A SIP OF WATER: zoloft , omeprazole and xanax if needed                                You may not have any metal on your body including hair pins and              piercings  Do not wear jewelry, make-up, lotions, powders or perfumes, deodorant             Do not wear nail polish.  Do not shave  48 hours prior to surgery.            Do not bring valuables to the hospital.  IS NOT             RESPONSIBLE   FOR VALUABLES.  Contacts, dentures or bridgework may not be worn into surgery.  Leave suitcase in the car. After surgery it may be brought to your room.                 Please read over the following fact sheets you were given: _____________________________________________________________________          [facility] - Preparing for Surgery Before surgery, you can play an important role.  Because skin is not sterile, your skin needs to be as free of germs as possible.  You can reduce the number of germs on your skin by washing with CHG (chlorahexidine gluconate) soap before surgery.  CHG is an antiseptic cleaner which kills germs and bonds with the skin to continue killing germs even after washing. Please DO NOT use if you have an allergy to CHG or antibacterial soaps.  If your skin becomes reddened/irritated stop using the CHG and inform your nurse when you arrive at Short Stay. Do not shave (including legs and underarms) for at least 48 hours prior to the first CHG shower.  You may shave your face/neck. Please follow these  instructions carefully:  1.  Shower with CHG Soap the night before surgery and the  morning of Surgery.  2.  If you choose to wash your hair, wash your hair first as usual with your  normal  shampoo.  3.  After you shampoo, rinse your hair and body thoroughly to remove the  shampoo.                           4.  Use CHG as you would any other liquid soap.  You can apply chg directly  to the skin and wash                       Gently with a scrungie or clean washcloth.  5.  Apply the CHG Soap to your body ONLY FROM THE NECK DOWN.  Do not use on face/ open                           Wound or open sores. Avoid contact with eyes, ears mouth and genitals (private parts).                       Wash face,  Genitals (private parts) with your normal soap.             6.  Wash thoroughly, paying special attention to the area where your surgery  will be performed.  7.  Thoroughly rinse your body with warm water from the neck down.  8.  DO NOT shower/wash with your normal soap after using and rinsing off  the CHG Soap.                9.  Pat yourself dry with a clean towel.            10.  Wear clean pajamas.            11.  Place clean sheets on your bed the night of your first shower and do not  sleep with pets. Day of Surgery : Do not apply any lotions/deodorants the morning of surgery.  Please wear clean clothes to the hospital/surgery center.  FAILURE TO FOLLOW THESE INSTRUCTIONS MAY RESULT IN THE CANCELLATION OF YOUR SURGERY PATIENT SIGNATURE_________________________________  NURSE SIGNATURE__________________________________  ________________________________________________________________________  WHAT IS A BLOOD TRANSFUSION? Blood Transfusion Information  A transfusion is the replacement of blood or some of its parts. Blood is made up of multiple cells which provide different functions.  Red blood cells carry oxygen and are used for blood loss replacement.  White blood cells fight against  infection.  Platelets control bleeding.  Plasma helps clot blood.  Other blood products are available for specialized needs, such as hemophilia or other clotting disorders. BEFORE THE TRANSFUSION  Who gives blood for transfusions?   Healthy volunteers who are fully evaluated to make sure their blood is safe. This is blood bank blood. Transfusion therapy is the safest it has ever been in the practice of medicine. Before blood is taken from a donor, a complete history is taken to make sure that person has no history of diseases nor engages in risky social behavior (examples are intravenous drug use or sexual activity with multiple partners). The donor's travel history is screened to minimize risk of transmitting infections, such as malaria. The donated blood is tested for signs of infectious diseases, such as HIV and hepatitis. The blood is then tested to be sure it is compatible with you in order to minimize the chance of a transfusion reaction. If you or a relative donates blood, this is often done in anticipation of surgery and is not appropriate for emergency situations. It takes many days to process the donated blood. RISKS AND COMPLICATIONS Although transfusion therapy is very safe and saves many lives, the main dangers of transfusion include:   Getting an infectious disease.  Developing a transfusion reaction. This is an allergic reaction to something in the blood you were given. Every precaution is taken to prevent this. The decision to have a blood transfusion has been considered carefully by your caregiver before blood is given. Blood is not given unless the benefits outweigh the risks. AFTER THE TRANSFUSION  Right after receiving a blood transfusion, you will usually feel much better and more energetic. This is especially  true if your red blood cells have gotten low (anemic). The transfusion raises the level of the red blood cells which carry oxygen, and this usually causes an energy  increase.  The nurse administering the transfusion will monitor you carefully for complications. HOME CARE INSTRUCTIONS  No special instructions are needed after a transfusion. You may find your energy is better. Speak with your caregiver about any limitations on activity for underlying diseases you may have. SEEK MEDICAL CARE IF:   Your condition is not improving after your transfusion.  You develop redness or irritation at the intravenous (IV) site. SEEK IMMEDIATE MEDICAL CARE IF:  Any of the following symptoms occur over the next 12 hours:  Shaking chills.  You have a temperature by mouth above 102 F (38.9 C), not controlled by medicine.  Chest, back, or muscle pain.  People around you feel you are not acting correctly or are confused.  Shortness of breath or difficulty breathing.  Dizziness and fainting.  You get a rash or develop hives.  You have a decrease in urine output.  Your urine turns a dark color or changes to pink, red, or brown. Any of the following symptoms occur over the next 10 days:  You have a temperature by mouth above 102 F (38.9 C), not controlled by medicine.  Shortness of breath.  Weakness after normal activity.  The white part of the eye turns yellow (jaundice).  You have a decrease in the amount of urine or are urinating less often.  Your urine turns a dark color or changes to pink, red, or brown. Document Released: 12/29/1999 Document Revised: 03/25/2011 Document Reviewed: 08/17/2007 Exit[employer] Patient Information 2014 [place].  _______________________________________________________________________  Incentive Spirometer  An incentive spirometer is a tool that can help keep your lungs clear and active. This tool measures how well you are filling your lungs with each breath. Taking long deep breaths may help reverse or decrease the chance of developing breathing (pulmonary) problems (especially infection) following:  A long  period of time when you are unable to move or be active. BEFORE THE PROCEDURE   If the spirometer includes an indicator to show your best effort, your nurse or respiratory therapist will set it to a desired goal.  If possible, sit up straight or lean slightly forward. Try not to slouch.  Hold the incentive spirometer in an upright position. INSTRUCTIONS FOR USE  1. Sit on the edge of your bed if possible, or sit up as far as you can in bed or on a chair. 2. Hold the incentive spirometer in an upright position. 3. Breathe out normally. 4. Place the mouthpiece in your mouth and seal your lips tightly around it. 5. Breathe in slowly and as deeply as possible, raising the piston or the ball toward the top of the column. 6. Hold your breath for 3-5 seconds or for as long as possible. Allow the piston or ball to fall to the bottom of the column. 7. Remove the mouthpiece from your mouth and breathe out normally. 8. Rest for a few seconds and repeat Steps 1 through 7 at least 10 times every 1-2 hours when you are awake. Take your time and take a few normal breaths between deep breaths. 9. The spirometer may include an indicator to show your best effort. Use the indicator as a goal to work toward during each repetition. 10. After each set of 10 deep breaths, practice coughing to be sure your lungs are clear. If you have  an incision (the cut made at the time of surgery), support your incision when coughing by placing a pillow or rolled up towels firmly against it. Once you are able to get out of bed, walk around indoors and cough well. You may stop using the incentive spirometer when instructed by your caregiver.  RISKS AND COMPLICATIONS  Take your time so you do not get dizzy or light-headed.  If you are in pain, you may need to take or ask for pain medication before doing incentive spirometry. It is harder to take a deep breath if you are having pain. AFTER USE  Rest and breathe slowly and  easily.  It can be helpful to keep track of a log of your progress. Your caregiver can provide you with a simple table to help with this. If you are using the spirometer at home, follow these instructions: [place] IF:   You are having difficultly using the spirometer.  You have trouble using the spirometer as often as instructed.  Your pain medication is not giving enough relief while using the spirometer.  You develop fever of 100.5 F (38.1 C) or higher. SEEK IMMEDIATE MEDICAL CARE IF:   You cough up bloody sputum that had not been present before.  You develop fever of 102 F (38.9 C) or greater.  You develop worsening pain at or near the incision site. MAKE SURE YOU:   Understand these instructions.  Will watch your condition.  Will get help right away if you are not doing well or get worse. Document Released: 05/13/2006 Document Revised: 03/25/2011 Document Reviewed: 07/14/2006 [facility] Patient Information 2014 [place], [place].   ________________________________________________________________________

## 2017-12-15 ENCOUNTER — Encounter (HOSPITAL_COMMUNITY)
Admission: RE | Admit: 2017-12-15 | Discharge: 2017-12-15 | Disposition: A | Discharge status: Home / Self Care | Payer: Worker's Compensation | Source: Ambulatory Visit | Attending: Orthopedic Surgery | Admitting: Orthopedic Surgery

## 2017-12-15 ENCOUNTER — Ambulatory Visit (HOSPITAL_COMMUNITY)
Admission: RE | Admit: 2017-12-15 | Discharge: 2017-12-15 | Disposition: A | Discharge status: Home / Self Care | Payer: Worker's Compensation | Source: Ambulatory Visit | Attending: Orthopedic Surgery | Admitting: Orthopedic Surgery

## 2017-12-15 ENCOUNTER — Other Ambulatory Visit: Payer: Self-pay

## 2017-12-15 ENCOUNTER — Encounter (HOSPITAL_COMMUNITY): Payer: Self-pay

## 2017-12-15 DIAGNOSIS — Z01818 Encounter for other preprocedural examination: Secondary | ICD-10-CM | POA: Diagnosis present

## 2017-12-15 HISTORY — DX: Headache: R51

## 2017-12-15 HISTORY — DX: Headache, unspecified: R51.9

## 2017-12-15 LAB — BASIC METABOLIC PANEL
Anion gap: 10 (ref 5–15)
BUN: 18 mg/dL (ref 6–20)
CO2: 24 mmol/L (ref 22–32)
Calcium: 9.5 mg/dL (ref 8.9–10.3)
Chloride: 106 mmol/L (ref 98–111)
Creatinine, Ser: 0.68 mg/dL (ref 0.44–1.00)
GFR calc Af Amer: >60 mL/min (ref >60)
GFR calc non Af Amer: >60 mL/min (ref >60)
Glucose, Bld: 112 mg/dL — ABNORMAL HIGH (ref 70–99)
Potassium: 4.3 mmol/L (ref 3.5–5.1)
SODIUM: 140 mmol/L (ref 135–145)

## 2017-12-15 LAB — CBC WITH DIFFERENTIAL/PLATELET
Abs Immature Granulocytes: 0.02 10*3/uL (ref 0.00–0.07)
Basophils Absolute: 0 10*3/uL (ref 0.0–0.1)
Basophils Relative: 1 %
EOS PCT: 4 %
Eosinophils Absolute: 0.3 10*3/uL (ref 0.0–0.5)
HCT: 43.9 % (ref 36.0–46.0)
Hemoglobin: 14.2 g/dL (ref 12.0–15.0)
Immature Granulocytes: 0 %
Lymphocytes Relative: 24 %
Lymphs Abs: 1.7 10*3/uL (ref 0.7–4.0)
MCH: 30.6 pg (ref 26.0–34.0)
MCHC: 32.3 g/dL (ref 30.0–36.0)
MCV: 94.6 fL (ref 80.0–100.0)
MONO ABS: 0.4 10*3/uL (ref 0.1–1.0)
Monocytes Relative: 6 %
Neutro Abs: 4.6 10*3/uL (ref 1.7–7.7)
Neutrophils Relative %: 65 %
Platelets: 335 10*3/uL (ref 150–400)
RBC: 4.64 MIL/uL (ref 3.87–5.11)
RDW: 12.2 % (ref 11.5–15.5)
WBC: 7 10*3/uL (ref 4.0–10.5)
nRBC: 0 % (ref 0.0–0.2)

## 2017-12-15 LAB — URINALYSIS, ROUTINE W REFLEX MICROSCOPIC
Bilirubin Urine: NEGATIVE
Glucose, UA: NEGATIVE mg/dL
Ketones, ur: NEGATIVE mg/dL
Leukocytes, UA: NEGATIVE
Nitrite: NEGATIVE
Protein, ur: NEGATIVE mg/dL
Specific Gravity, Urine: 1.012 (ref 1.005–1.030)
pH: 5 (ref 5.0–8.0)

## 2017-12-15 LAB — PROTIME-INR
INR: 0.94
Prothrombin Time: 12.5 seconds (ref 11.4–15.2)

## 2017-12-15 LAB — APTT: aPTT: 26 seconds (ref 24–36)

## 2017-12-15 LAB — SURGICAL PCR SCREEN
MRSA, PCR: NEGATIVE
Staphylococcus aureus: NEGATIVE

## 2017-12-15 LAB — ABO/RH: ABO/RH(D): A POS

## 2017-12-15 NOTE — Progress Notes (Signed)
ekg 05-04-17 in epic

## 2017-12-18 DIAGNOSIS — M1712 Unilateral primary osteoarthritis, left knee: Secondary | ICD-10-CM | POA: Diagnosis present

## 2017-12-18 NOTE — H&P (Signed)
TOTAL KNEE ADMISSION H&P  Patient is being admitted for left total knee arthroplasty.  Subjective:  Chief Complaint:left knee pain.  HPI: Amber Haley, 52 y.o. female, has a history of pain and functional disability in the left knee due to trauma and arthritis and has failed non-surgical conservative treatments for greater than 12 weeks to includeNSAID's and/or analgesics, corticosteriod injections, flexibility and strengthening excercises, weight reduction as appropriate and activity modification.  Onset of symptoms was abrupt, starting 1 years ago with rapidlly worsening course since that time. The patient noted prior procedures on the knee to include  open reduction internal fixation by Dr. Jena Gauss using a lateral locking plate on the left knee(s).  Patient currently rates pain in the left knee(s) at 10 out of 10 with activity. Patient has night pain, worsening of pain with activity and weight bearing, pain that interferes with activities of daily living, pain with passive range of motion, crepitus and joint swelling.  Patient has evidence of subchondral sclerosis, joint subluxation and joint space narrowing by imaging studies.   There is no active infection.  Patient Active Problem List   Diagnosis Date Noted  . Nicotine dependence 05/05/2017  . Closed fracture of lateral portion of left tibial plateau 05/01/2017   Past Medical History:  Diagnosis Date  . Anxiety   . GERD (gastroesophageal reflux disease)    occasional  . Headache    hx of migraines  . Nicotine dependence 05/05/2017  . Tibial plateau fracture, left 05/01/2017   severely comminuted left tibial plateau fracture occurring today when a stack of cases of orange juice fell onto her leg./notes 05/02/2017    Past Surgical History:  Procedure Laterality Date  . JOINT REPLACEMENT     Dr. Gean Birchwood Left total Arthroplasty 12-22-17  . NO PAST SURGERIES    . ORIF TIBIA PLATEAU Left 05/05/2017   Procedure: OPEN REDUCTION INTERNAL  FIXATION (ORIF) TIBIAL PLATEAU;  Surgeon: Roby Lofts, MD;  Location: [facility] OR;  Service: Orthopedics;  Laterality: Left;    No current facility-administered medications for this encounter.    Current Outpatient Medications  Medication Sig Dispense Refill Last Dose  . ALPRAZolam (XANAX) 0.25 MG tablet Take 0.25 mg by mouth daily as needed for anxiety.    Unk at [employer]  . Multiple Vitamins-Calcium (ONE-A-DAY WOMENS PO) Take 1 tablet by mouth daily.   05/01/2017 at Unknown time  . omeprazole (PRILOSEC OTC) 20 MG tablet Take 20 mg by mouth daily as needed (heartburn).    05/01/2017 at Unknown time  . Probiotic Product (ALIGN) 4 MG CAPS Take 4 mg by mouth daily.   05/01/2017 at am  . sertraline (ZOLOFT) 100 MG tablet Take 100 mg by mouth daily.   05/01/2017 at am  . traZODone (DESYREL) 50 MG tablet Take 50 mg by mouth at bedtime as needed for sleep.    Past Week at Unknown time  . methocarbamol (ROBAXIN) 750 MG tablet Take 1 tablet (750 mg total) by mouth 4 (four) times daily. (Patient not taking: Reported on 12/08/2017) 25 tablet 0 Not Taking at Unknown time  . oxyCODONE-acetaminophen (PERCOCET/ROXICET) 5-325 MG tablet Take 1-2 tablets by mouth every 4 (four) hours as needed for severe pain. (Patient not taking: Reported on 12/08/2017) 30 tablet 0 Not Taking at Unknown time   No Known Allergies  Social History   Tobacco Use  . Smoking status: Current Every Day Smoker    Packs/day: 0.12    Years: 39.00    Pack  years: 4.68    Types: Cigarettes  . Smokeless tobacco: Never Used  . Tobacco comment: 2-3 a day  Substance Use Topics  . Alcohol use: Yes    Alcohol/week: 10.0 standard drinks    Types: 10 Cans of beer per week    Comment: occasional    No family history on file.   Review of Systems  Constitutional: Negative.   HENT: Negative.   Eyes: Negative.   Respiratory: Negative.   Cardiovascular: Negative.   Gastrointestinal: Negative.   Genitourinary: Negative.   Musculoskeletal:  Positive for joint pain.  Skin: Negative.   Neurological: Negative.   Endo/Heme/Allergies: Negative.   Psychiatric/Behavioral: Positive for depression.    Objective:  Physical Exam  Constitutional: She is oriented to person, place, and time. She appears well-developed and well-nourished.  HENT:  Head: Normocephalic and atraumatic.  Eyes: Pupils are equal, round, and reactive to light.  Neck: Normal range of motion. Neck supple.  Cardiovascular: Intact distal pulses.  Respiratory: Effort normal.  Musculoskeletal: She exhibits tenderness.  Patient has obvious valgus deformity of the left knee with walking.  There is crepitus.  The catching happens  on occasion when she is walking, with a sharp crack or pop.  She is tender along the lateral joint line.  Collateral ligaments 1+ laxity to varus stress.  Good endpoint.  Surgical scar is well-healed.  Range of motion 0/140.  Neurovascular intact distally.  Trace effusion to palpation.  Neurological: She is alert and oriented to person, place, and time.  Skin: Skin is warm and dry.  Psychiatric: She has a normal mood and affect. Her behavior is normal. Judgment and thought content normal.    Vital signs in last 24 hours:    Labs:   Estimated body mass index is 23.26 kg/m as calculated from the following:   Height as of 12/15/17: 5\' 8"  (1.727 m).   Weight as of 12/15/17: 69.4 kg.   Imaging Review Plain radiographs demonstrate standing AP, Rosenberg and sunrise x-rays show a 15 valgus deformity to the left knee 7 valgus angle of the right knee.  There is some deformity of the tibial plateau, especially near the plate.  The medial side has a normal appearance.  The patellofemoral joint, mild arthritic changes.   Preoperative templating of the joint replacement has been completed, documented, and submitted to the Operating Room personnel in order to optimize intra-operative equipment management.    Patient's anticipated LOS is less  than 2 midnights, meeting these requirements: - Younger than 53 - Lives within 1 hour of care - Has a competent adult at home to recover with post-op recover - NO history of  - Chronic pain requiring opiods  - Diabetes  - Coronary Artery Disease  - Heart failure  - Heart attack  - Stroke  - DVT/VTE  - Cardiac arrhythmia  - Respiratory Failure/COPD  - Renal failure  - Anemia  - Advanced Liver disease        Assessment/Plan:  End stage arthritis, left knee   The patient history, physical examination, clinical judgment of the provider and imaging studies are consistent with end stage degenerative joint disease of the left knee(s) and total knee arthroplasty is deemed medically necessary. The treatment options including medical management, injection therapy arthroscopy and arthroplasty were discussed at length. The risks and benefits of total knee arthroplasty were presented and reviewed. The risks due to aseptic loosening, infection, stiffness, patella tracking problems, thromboembolic complications and other imponderables were discussed. The patient  acknowledged the explanation, agreed to proceed with the plan and consent was signed. Patient is being admitted for inpatient treatment for surgery, pain control, PT, OT, prophylactic antibiotics, VTE prophylaxis, progressive ambulation and ADL's and discharge planning. The patient is planning to be discharged home with home health services.

## 2017-12-21 MED ORDER — BUPIVACAINE LIPOSOME 1.3 % IJ SUSP
20.0000 mL | INTRAMUSCULAR | Status: DC
  Filled 2017-12-21: qty 20

## 2017-12-21 MED ORDER — TRANEXAMIC ACID 1000 MG/10ML IV SOLN
2000.0000 mg | INTRAVENOUS | Status: DC
  Filled 2017-12-21: qty 20

## 2017-12-22 ENCOUNTER — Other Ambulatory Visit: Payer: Self-pay

## 2017-12-22 ENCOUNTER — Ambulatory Visit (HOSPITAL_COMMUNITY)
Admission: RE | Admit: 2017-12-22 | Discharge: 2017-12-23 | Disposition: A | Discharge status: Home / Self Care | Payer: Worker's Compensation | Source: Ambulatory Visit | Attending: Orthopedic Surgery | Admitting: Orthopedic Surgery

## 2017-12-22 ENCOUNTER — Encounter (HOSPITAL_COMMUNITY): Payer: Self-pay | Admitting: *Deleted

## 2017-12-22 ENCOUNTER — Encounter (HOSPITAL_COMMUNITY)
Admission: RE | Disposition: A | Discharge status: Home / Self Care | Payer: Self-pay | Source: Ambulatory Visit | Attending: Orthopedic Surgery

## 2017-12-22 ENCOUNTER — Ambulatory Visit (HOSPITAL_COMMUNITY): Payer: Worker's Compensation | Admitting: Anesthesiology

## 2017-12-22 DIAGNOSIS — K219 Gastro-esophageal reflux disease without esophagitis: Secondary | ICD-10-CM | POA: Diagnosis not present

## 2017-12-22 DIAGNOSIS — F419 Anxiety disorder, unspecified: Secondary | ICD-10-CM | POA: Insufficient documentation

## 2017-12-22 DIAGNOSIS — Z79899 Other long term (current) drug therapy: Secondary | ICD-10-CM | POA: Insufficient documentation

## 2017-12-22 DIAGNOSIS — M1712 Unilateral primary osteoarthritis, left knee: Secondary | ICD-10-CM | POA: Insufficient documentation

## 2017-12-22 DIAGNOSIS — D62 Acute posthemorrhagic anemia: Secondary | ICD-10-CM | POA: Diagnosis not present

## 2017-12-22 DIAGNOSIS — F17210 Nicotine dependence, cigarettes, uncomplicated: Secondary | ICD-10-CM | POA: Insufficient documentation

## 2017-12-22 HISTORY — PX: TOTAL KNEE ARTHROPLASTY: SHX125

## 2017-12-22 LAB — TYPE AND SCREEN
ABO/RH(D): A POS
ANTIBODY SCREEN: NEGATIVE

## 2017-12-22 SURGERY — ARTHROPLASTY, KNEE, TOTAL
Anesthesia: Spinal | Site: Knee | Laterality: Left

## 2017-12-22 MED ORDER — TRANEXAMIC ACID-NACL 1000-0.7 MG/100ML-% IV SOLN
1000.0000 mg | Freq: Once | INTRAVENOUS | Status: AC
  Administered 2017-12-22: 1000 mg via INTRAVENOUS
  Filled 2017-12-22: qty 100

## 2017-12-22 MED ORDER — SODIUM CHLORIDE (PF) 0.9 % IJ SOLN
INTRAMUSCULAR | Status: AC
  Filled 2017-12-22: qty 10

## 2017-12-22 MED ORDER — OXYCODONE HCL 5 MG PO TABS
5.0000 mg | ORAL_TABLET | ORAL | Status: DC | PRN
  Administered 2017-12-22 – 2017-12-23 (×6): 10 mg via ORAL
  Filled 2017-12-22 (×6): qty 2

## 2017-12-22 MED ORDER — METHOCARBAMOL 500 MG PO TABS
500.0000 mg | ORAL_TABLET | Freq: Four times a day (QID) | ORAL | Status: DC | PRN
  Administered 2017-12-22 – 2017-12-23 (×2): 500 mg via ORAL
  Filled 2017-12-22 (×4): qty 1

## 2017-12-22 MED ORDER — DEXAMETHASONE SODIUM PHOSPHATE 10 MG/ML IJ SOLN
INTRAMUSCULAR | Status: DC | PRN
  Administered 2017-12-22: 10 mg via INTRAVENOUS

## 2017-12-22 MED ORDER — DIPHENHYDRAMINE HCL 12.5 MG/5ML PO ELIX: 12.5000 mg | ORAL_SOLUTION | ORAL | Status: DC | PRN

## 2017-12-22 MED ORDER — MENTHOL 3 MG MT LOZG: 1.0000 | LOZENGE | OROMUCOSAL | Status: DC | PRN

## 2017-12-22 MED ORDER — ONDANSETRON HCL 4 MG/2ML IJ SOLN: 4.0000 mg | Freq: Four times a day (QID) | INTRAMUSCULAR | Status: DC | PRN

## 2017-12-22 MED ORDER — SERTRALINE HCL 100 MG PO TABS
100.0000 mg | ORAL_TABLET | Freq: Every day | ORAL | Status: DC
  Administered 2017-12-23: 100 mg via ORAL
  Filled 2017-12-22: qty 1

## 2017-12-22 MED ORDER — SODIUM CHLORIDE 0.9 % IR SOLN
Status: DC | PRN
  Administered 2017-12-22 (×2): 1000 mL

## 2017-12-22 MED ORDER — GABAPENTIN 300 MG PO CAPS
300.0000 mg | ORAL_CAPSULE | Freq: Three times a day (TID) | ORAL | Status: DC
  Administered 2017-12-22 – 2017-12-23 (×3): 300 mg via ORAL
  Filled 2017-12-22 (×3): qty 1

## 2017-12-22 MED ORDER — ONDANSETRON HCL 4 MG PO TABS: 4.0000 mg | ORAL_TABLET | Freq: Four times a day (QID) | ORAL | Status: DC | PRN

## 2017-12-22 MED ORDER — CEFAZOLIN SODIUM-DEXTROSE 2-4 GM/100ML-% IV SOLN
2.0000 g | INTRAVENOUS | Status: AC
  Administered 2017-12-22: 2 g via INTRAVENOUS
  Filled 2017-12-22: qty 100

## 2017-12-22 MED ORDER — KCL IN DEXTROSE-NACL 20-5-0.45 MEQ/L-%-% IV SOLN
INTRAVENOUS | Status: DC
  Administered 2017-12-22 – 2017-12-23 (×4): via INTRAVENOUS
  Filled 2017-12-22 (×4): qty 1000

## 2017-12-22 MED ORDER — PROPOFOL 10 MG/ML IV BOLUS
INTRAVENOUS | Status: AC
  Filled 2017-12-22: qty 60

## 2017-12-22 MED ORDER — ONDANSETRON HCL 4 MG/2ML IJ SOLN: 4.0000 mg | Freq: Once | INTRAMUSCULAR | Status: DC | PRN

## 2017-12-22 MED ORDER — TRANEXAMIC ACID-NACL 1000-0.7 MG/100ML-% IV SOLN
1000.0000 mg | INTRAVENOUS | Status: AC
  Administered 2017-12-22: 1000 mg via INTRAVENOUS
  Filled 2017-12-22: qty 100

## 2017-12-22 MED ORDER — ASPIRIN EC 81 MG PO TBEC: 81.0000 mg | DELAYED_RELEASE_TABLET | Freq: Two times a day (BID) | ORAL | 0 refills | Status: AC

## 2017-12-22 MED ORDER — DOCUSATE SODIUM 100 MG PO CAPS
100.0000 mg | ORAL_CAPSULE | Freq: Two times a day (BID) | ORAL | Status: DC
  Administered 2017-12-22 – 2017-12-23 (×2): 100 mg via ORAL
  Filled 2017-12-22 (×2): qty 1

## 2017-12-22 MED ORDER — ROPIVACAINE HCL 7.5 MG/ML IJ SOLN
INTRAMUSCULAR | Status: DC | PRN
  Administered 2017-12-22: 20 mL via PERINEURAL

## 2017-12-22 MED ORDER — SODIUM CHLORIDE (PF) 0.9 % IJ SOLN
INTRAMUSCULAR | Status: DC | PRN
  Administered 2017-12-22: 70 mL

## 2017-12-22 MED ORDER — BUPIVACAINE IN DEXTROSE 0.75-8.25 % IT SOLN
INTRATHECAL | Status: DC | PRN
  Administered 2017-12-22: 1.6 mL via INTRATHECAL

## 2017-12-22 MED ORDER — METHOCARBAMOL 500 MG IVPB - SIMPLE MED
INTRAVENOUS | Status: AC
  Filled 2017-12-22: qty 50

## 2017-12-22 MED ORDER — ONDANSETRON HCL 4 MG/2ML IJ SOLN
INTRAMUSCULAR | Status: AC
  Filled 2017-12-22: qty 2

## 2017-12-22 MED ORDER — TIZANIDINE HCL 2 MG PO TABS: 2.0000 mg | ORAL_TABLET | Freq: Four times a day (QID) | ORAL | 0 refills | Status: AC | PRN

## 2017-12-22 MED ORDER — BISACODYL 5 MG PO TBEC: 5.0000 mg | DELAYED_RELEASE_TABLET | Freq: Every day | ORAL | Status: DC | PRN

## 2017-12-22 MED ORDER — FENTANYL CITRATE (PF) 100 MCG/2ML IJ SOLN
25.0000 ug | INTRAMUSCULAR | Status: DC | PRN
  Administered 2017-12-22: 50 ug via INTRAVENOUS

## 2017-12-22 MED ORDER — PANTOPRAZOLE SODIUM 40 MG PO TBEC: 40.0000 mg | DELAYED_RELEASE_TABLET | Freq: Every day | ORAL | Status: DC | PRN

## 2017-12-22 MED ORDER — MIDAZOLAM HCL 2 MG/2ML IJ SOLN
INTRAMUSCULAR | Status: AC
  Filled 2017-12-22: qty 2

## 2017-12-22 MED ORDER — BUPIVACAINE-EPINEPHRINE (PF) 0.25% -1:200000 IJ SOLN
INTRAMUSCULAR | Status: AC
  Filled 2017-12-22: qty 30

## 2017-12-22 MED ORDER — PROPOFOL 10 MG/ML IV BOLUS
INTRAVENOUS | Status: AC
  Filled 2017-12-22: qty 20

## 2017-12-22 MED ORDER — FENTANYL CITRATE (PF) 100 MCG/2ML IJ SOLN
INTRAMUSCULAR | Status: AC
  Filled 2017-12-22: qty 2

## 2017-12-22 MED ORDER — METOCLOPRAMIDE HCL 5 MG/ML IJ SOLN: 5.0000 mg | Freq: Three times a day (TID) | INTRAMUSCULAR | Status: DC | PRN

## 2017-12-22 MED ORDER — TRANEXAMIC ACID 1000 MG/10ML IV SOLN
INTRAVENOUS | Status: DC | PRN
  Administered 2017-12-22: 2000 mg via TOPICAL

## 2017-12-22 MED ORDER — MIDAZOLAM HCL 2 MG/2ML IJ SOLN
1.0000 mg | INTRAMUSCULAR | Status: DC
  Administered 2017-12-22: 2 mg via INTRAVENOUS
  Filled 2017-12-22: qty 2

## 2017-12-22 MED ORDER — POLYETHYLENE GLYCOL 3350 17 G PO PACK: 17.0000 g | PACK | Freq: Every day | ORAL | Status: DC | PRN

## 2017-12-22 MED ORDER — PHENOL 1.4 % MT LIQD
1.0000 | OROMUCOSAL | Status: DC | PRN
  Filled 2017-12-22: qty 177

## 2017-12-22 MED ORDER — BUPIVACAINE-EPINEPHRINE (PF) 0.25% -1:200000 IJ SOLN
INTRAMUSCULAR | Status: DC | PRN
  Administered 2017-12-22: 30 mL

## 2017-12-22 MED ORDER — CHLORHEXIDINE GLUCONATE 4 % EX LIQD: 60.0000 mL | Freq: Once | CUTANEOUS | Status: DC

## 2017-12-22 MED ORDER — PANTOPRAZOLE SODIUM 40 MG PO TBEC
40.0000 mg | DELAYED_RELEASE_TABLET | Freq: Every day | ORAL | Status: DC
  Administered 2017-12-22 – 2017-12-23 (×2): 40 mg via ORAL
  Filled 2017-12-22 (×2): qty 1

## 2017-12-22 MED ORDER — ALUM & MAG HYDROXIDE-SIMETH 200-200-20 MG/5ML PO SUSP: 30.0000 mL | ORAL | Status: DC | PRN

## 2017-12-22 MED ORDER — SODIUM CHLORIDE (PF) 0.9 % IJ SOLN
INTRAMUSCULAR | Status: AC
  Filled 2017-12-22: qty 50

## 2017-12-22 MED ORDER — TRAZODONE HCL 50 MG PO TABS: 50.0000 mg | ORAL_TABLET | Freq: Every evening | ORAL | Status: DC | PRN

## 2017-12-22 MED ORDER — ASPIRIN 81 MG PO CHEW
81.0000 mg | CHEWABLE_TABLET | Freq: Two times a day (BID) | ORAL | Status: DC
  Administered 2017-12-22 – 2017-12-23 (×2): 81 mg via ORAL
  Filled 2017-12-22 (×2): qty 1

## 2017-12-22 MED ORDER — BUPIVACAINE LIPOSOME 1.3 % IJ SUSP: 20.0000 mL | Freq: Once | INTRAMUSCULAR | Status: DC

## 2017-12-22 MED ORDER — PROPOFOL 500 MG/50ML IV EMUL
INTRAVENOUS | Status: DC | PRN
  Administered 2017-12-22: 120 ug/kg/min via INTRAVENOUS

## 2017-12-22 MED ORDER — LACTATED RINGERS IV SOLN
INTRAVENOUS | Status: DC
  Administered 2017-12-22 (×2): via INTRAVENOUS

## 2017-12-22 MED ORDER — METOCLOPRAMIDE HCL 5 MG PO TABS: 5.0000 mg | ORAL_TABLET | Freq: Three times a day (TID) | ORAL | Status: DC | PRN

## 2017-12-22 MED ORDER — ALPRAZOLAM 0.25 MG PO TABS: 0.2500 mg | ORAL_TABLET | Freq: Every day | ORAL | Status: DC | PRN

## 2017-12-22 MED ORDER — WATER FOR IRRIGATION, STERILE IR SOLN
Status: DC | PRN
  Administered 2017-12-22: 2000 mL

## 2017-12-22 MED ORDER — METHOCARBAMOL 500 MG IVPB - SIMPLE MED
500.0000 mg | Freq: Four times a day (QID) | INTRAVENOUS | Status: DC | PRN
  Administered 2017-12-22: 500 mg via INTRAVENOUS
  Filled 2017-12-22: qty 50

## 2017-12-22 MED ORDER — CLONIDINE HCL (ANALGESIA) 100 MCG/ML EP SOLN
EPIDURAL | Status: DC | PRN
  Administered 2017-12-22: 50 ug

## 2017-12-22 MED ORDER — TRANEXAMIC ACID-NACL 1000-0.7 MG/100ML-% IV SOLN: 1000.0000 mg | INTRAVENOUS | Status: DC

## 2017-12-22 MED ORDER — ACETAMINOPHEN 325 MG PO TABS
325.0000 mg | ORAL_TABLET | Freq: Four times a day (QID) | ORAL | Status: DC | PRN
  Administered 2017-12-23: 650 mg via ORAL
  Filled 2017-12-22 (×2): qty 2

## 2017-12-22 MED ORDER — FLEET ENEMA 7-19 GM/118ML RE ENEM: 1.0000 | ENEMA | Freq: Once | RECTAL | Status: DC | PRN

## 2017-12-22 MED ORDER — DEXAMETHASONE SODIUM PHOSPHATE 10 MG/ML IJ SOLN
10.0000 mg | Freq: Once | INTRAMUSCULAR | Status: AC
  Administered 2017-12-23: 10 mg via INTRAVENOUS
  Filled 2017-12-22: qty 1

## 2017-12-22 MED ORDER — OMEPRAZOLE MAGNESIUM 20 MG PO TBEC: 20.0000 mg | DELAYED_RELEASE_TABLET | Freq: Every day | ORAL | Status: DC | PRN

## 2017-12-22 MED ORDER — DEXAMETHASONE SODIUM PHOSPHATE 10 MG/ML IJ SOLN
INTRAMUSCULAR | Status: AC
  Filled 2017-12-22: qty 1

## 2017-12-22 MED ORDER — EPHEDRINE 5 MG/ML INJ
INTRAVENOUS | Status: AC
  Filled 2017-12-22: qty 10

## 2017-12-22 MED ORDER — FENTANYL CITRATE (PF) 100 MCG/2ML IJ SOLN
50.0000 ug | INTRAMUSCULAR | Status: DC
  Administered 2017-12-22 (×2): 50 ug via INTRAVENOUS
  Filled 2017-12-22: qty 2

## 2017-12-22 MED ORDER — MIDAZOLAM HCL 5 MG/5ML IJ SOLN
INTRAMUSCULAR | Status: DC | PRN
  Administered 2017-12-22 (×2): 1 mg via INTRAVENOUS

## 2017-12-22 MED ORDER — HYDROMORPHONE HCL 1 MG/ML IJ SOLN
0.5000 mg | INTRAMUSCULAR | Status: DC | PRN
  Administered 2017-12-22: 1 mg via INTRAVENOUS
  Filled 2017-12-22: qty 1

## 2017-12-22 MED ORDER — PROPOFOL 10 MG/ML IV BOLUS
INTRAVENOUS | Status: DC | PRN
  Administered 2017-12-22 (×7): 20 mg via INTRAVENOUS
  Administered 2017-12-22: 30 mg via INTRAVENOUS

## 2017-12-22 MED ORDER — EPHEDRINE SULFATE-NACL 50-0.9 MG/10ML-% IV SOSY
PREFILLED_SYRINGE | INTRAVENOUS | Status: DC | PRN
  Administered 2017-12-22 (×4): 5 mg via INTRAVENOUS

## 2017-12-22 MED ORDER — BUPIVACAINE LIPOSOME 1.3 % IJ SUSP
INTRAMUSCULAR | Status: DC | PRN
  Administered 2017-12-22: 20 mL

## 2017-12-22 MED ORDER — ONDANSETRON HCL 4 MG/2ML IJ SOLN
INTRAMUSCULAR | Status: DC | PRN
  Administered 2017-12-22: 4 mg via INTRAVENOUS

## 2017-12-22 MED ORDER — OXYCODONE-ACETAMINOPHEN 5-325 MG PO TABS: 1.0000 | ORAL_TABLET | ORAL | 0 refills | Status: AC | PRN

## 2017-12-22 SURGICAL SUPPLY — 57 items
ATTUNE PSFEM LTSZ6 NARCEM KNEE (Femur) ×3 IMPLANT
ATTUNE PSRP INSR SZ6 5 KNEE (Insert) ×2 IMPLANT
ATTUNE PSRP INSR SZ6 5MM KNEE (Insert) ×1 IMPLANT
BAG DECANTER FOR FLEXI CONT (MISCELLANEOUS) ×3 IMPLANT
BAG ZIPLOCK 12X15 (MISCELLANEOUS) ×3 IMPLANT
BANDAGE ACE 6X5 VEL STRL LF (GAUZE/BANDAGES/DRESSINGS) ×3 IMPLANT
BASE TIBIAL ROT PLAT SZ 5 KNEE (Knees) ×1 IMPLANT
BLADE SAG 18X100X1.27 (BLADE) ×3 IMPLANT
BLADE SAW SGTL 11.0X1.19X90.0M (BLADE) ×3 IMPLANT
BLADE SURG SZ10 CARB STEEL (BLADE) ×6 IMPLANT
BNDG ELASTIC 6X10 VLCR STRL LF (GAUZE/BANDAGES/DRESSINGS) ×3 IMPLANT
BOWL SMART MIX CTS (DISPOSABLE) ×3 IMPLANT
CEMENT HV SMART SET (Cement) ×6 IMPLANT
COVER SURGICAL LIGHT HANDLE (MISCELLANEOUS) ×3 IMPLANT
COVER WAND RF STERILE (DRAPES) ×3 IMPLANT
CUFF TOURN SGL QUICK 34 (TOURNIQUET CUFF) ×2
CUFF TRNQT CYL 34X4X40X1 (TOURNIQUET CUFF) ×1 IMPLANT
DECANTER SPIKE VIAL GLASS SM (MISCELLANEOUS) ×9 IMPLANT
DRAPE U-SHAPE 47X51 STRL (DRAPES) ×3 IMPLANT
DRESSING AQUACEL AG SP 3.5X10 (GAUZE/BANDAGES/DRESSINGS) ×1 IMPLANT
DRSG AQUACEL AG ADV 3.5X 4 (GAUZE/BANDAGES/DRESSINGS) ×3 IMPLANT
DRSG AQUACEL AG ADV 3.5X10 (GAUZE/BANDAGES/DRESSINGS) ×3 IMPLANT
DRSG AQUACEL AG SP 3.5X10 (GAUZE/BANDAGES/DRESSINGS) ×3
DURAPREP 26ML APPLICATOR (WOUND CARE) ×3 IMPLANT
ELECT REM PT RETURN 15FT ADLT (MISCELLANEOUS) ×3 IMPLANT
GLOVE BIO SURGEON STRL SZ7.5 (GLOVE) ×3 IMPLANT
GLOVE BIO SURGEON STRL SZ8.5 (GLOVE) ×3 IMPLANT
GLOVE BIOGEL PI IND STRL 8 (GLOVE) ×1 IMPLANT
GLOVE BIOGEL PI IND STRL 9 (GLOVE) ×1 IMPLANT
GLOVE BIOGEL PI INDICATOR 8 (GLOVE) ×2
GLOVE BIOGEL PI INDICATOR 9 (GLOVE) ×2
GOWN STRL REUS W/TWL XL LVL3 (GOWN DISPOSABLE) ×6 IMPLANT
HANDPIECE INTERPULSE COAX TIP (DISPOSABLE) ×2
HOOD PEEL AWAY FLYTE STAYCOOL (MISCELLANEOUS) ×9 IMPLANT
NEEDLE HYPO 21X1.5 SAFETY (NEEDLE) ×6 IMPLANT
NS IRRIG 1000ML POUR BTL (IV SOLUTION) ×3 IMPLANT
PACK ICE MAXI GEL EZY WRAP (MISCELLANEOUS) ×3 IMPLANT
PACK TOTAL KNEE CUSTOM (KITS) ×3 IMPLANT
PATELLA MEDIAL ATTUN 35MM KNEE (Knees) ×3 IMPLANT
PIN STEINMAN FIXATION KNEE (PIN) ×3 IMPLANT
PIN THREADED HEADED SIGMA (PIN) ×3 IMPLANT
PROTECTOR NERVE ULNAR (MISCELLANEOUS) ×3 IMPLANT
SET HNDPC FAN SPRY TIP SCT (DISPOSABLE) ×1 IMPLANT
STAPLER VISISTAT 35W (STAPLE) IMPLANT
SUT VIC AB 0 CT1 36 (SUTURE) ×3 IMPLANT
SUT VIC AB 1 CTX 36 (SUTURE) ×2
SUT VIC AB 1 CTX36XBRD ANBCTR (SUTURE) ×1 IMPLANT
SUT VIC AB 2-0 CT1 27 (SUTURE) ×2
SUT VIC AB 2-0 CT1 TAPERPNT 27 (SUTURE) ×1 IMPLANT
SUT VIC AB 3-0 CT1 27 (SUTURE) ×2
SUT VIC AB 3-0 CT1 TAPERPNT 27 (SUTURE) ×1 IMPLANT
SYR CONTROL 10ML LL (SYRINGE) ×6 IMPLANT
TIBIAL BASE ROT PLAT SZ 5 KNEE (Knees) ×3 IMPLANT
TRAY FOLEY MTR SLVR 16FR STAT (SET/KITS/TRAYS/PACK) ×3 IMPLANT
WATER STERILE IRR 1000ML POUR (IV SOLUTION) ×6 IMPLANT
WRAP KNEE MAXI GEL POST OP (GAUZE/BANDAGES/DRESSINGS) ×3 IMPLANT
YANKAUER SUCT BULB TIP 10FT TU (MISCELLANEOUS) ×3 IMPLANT

## 2017-12-22 NOTE — Interval H&P Note (Signed)
History and Physical Interval Note:  12/22/2017 7:08 AM  Amber Haley  has presented today for surgery, with the diagnosis of LEFT KNEE OSTEOARTHRITIS, RETAINED TIBIA PLATE  The various methods of treatment have been discussed with the patient and family. After consideration of risks, benefits and other options for treatment, the patient has consented to  Procedure(s): TOTAL KNEE ARTHROPLASTY WITH HARDWARE REMOVAL (Left) as a surgical intervention .  The patient's history has been reviewed, patient examined, no change in status, stable for surgery.  I have reviewed the patient's chart and labs.  Questions were answered to the patient's satisfaction.     Nestor LewandowskyFrank J Joslyn Ramos

## 2017-12-22 NOTE — Anesthesia Procedure Notes (Signed)
Spinal  Patient location during procedure: OR Start time: 12/22/2017 8:31 AM End time: 12/22/2017 8:34 AM Staffing Anesthesiologist: Cecile Hearingurk, Kyston Gonce Edward, MD Performed: anesthesiologist  Preanesthetic Checklist Completed: patient identified, surgical consent, pre-op evaluation, timeout performed, IV checked, risks and benefits discussed and monitors and equipment checked Spinal Block Patient position: sitting Prep: site prepped and draped and DuraPrep Patient monitoring: continuous pulse ox and blood pressure Approach: midline Location: L3-4 Injection technique: single-shot Needle Needle type: Pencan  Needle gauge: 24 G Additional Notes Functioning IV was confirmed and monitors were applied. Sterile prep and drape, including hand hygiene, mask and sterile gloves were used. The patient was positioned and the spine was prepped. The skin was anesthetized with lidocaine.  Free flow of clear CSF was obtained prior to injecting local anesthetic into the CSF.  The spinal needle aspirated freely following injection.  The needle was carefully withdrawn.  The patient tolerated the procedure well. Consent was obtained prior to procedure with all questions answered and concerns addressed. Risks including but not limited to bleeding, infection, nerve damage, paralysis, failed block, inadequate analgesia, allergic reaction, high spinal, itching and headache were discussed and the patient wished to proceed.   Amber AranStephen Mallorie Norrod, MD

## 2017-12-22 NOTE — Progress Notes (Signed)
AssistedDr. Turk with left, ultrasound guided, adductor canal block. Side rails up, monitors on throughout procedure. See vital signs in flow sheet. Tolerated Procedure well.  

## 2017-12-22 NOTE — Anesthesia Procedure Notes (Signed)
Anesthesia Regional Block: Adductor canal block   Pre-Anesthetic Checklist: ,, timeout performed, Correct Patient, Correct Site, Correct Laterality, Correct Procedure, Correct Position, site marked, Risks and benefits discussed,  Surgical consent,  Pre-op evaluation,  At surgeon's request and post-op pain management  Laterality: Lower and Left  Prep: chloraprep       Needles:  Injection technique: Single-shot  Needle Type: Echogenic Stimulator Needle     Needle Length: 9cm  Needle Gauge: 21     Additional Needles:   Procedures:,,,, ultrasound used (permanent image in chart),,,,  Narrative:  Start time: 12/22/2017 8:04 AM End time: 12/22/2017 8:09 AM Injection made incrementally with aspirations every 5 mL.  Performed by: Personally  Anesthesiologist: Leilani AbleHatchett, Franklin, MD  Additional Notes: No pain on injection. No increased resistance to injection. Injection made in 5cc increments.  Good needle visualization.  Patient tolerated procedure well.

## 2017-12-22 NOTE — Op Note (Addendum)
PATIENT ID:      Amber Haley  MRN:     [id number] DOB/AGE:    [birth year] / 52 y.o.       OPERATIVE REPORT    DATE OF PROCEDURE:  12/22/2017       PREOPERATIVE DIAGNOSIS:   LEFT KNEE OSTEOARTHRITIS, RETAINED TIBIA PLATE      Estimated body mass index is 23.26 kg/m as calculated from the following:   Height as of this encounter: 5\' 8"  (1.727 m).   Weight as of this encounter: 69.4 kg.                                                        POSTOPERATIVE DIAGNOSIS:   LEFT KNEE OSTEOARTHRITIS, RETAINED TIBIA PLATE                                                                      PROCEDURE:  Procedure(s): TOTAL KNEE ARTHROPLASTY WITH HARDWARE REMOVAL Using DepuyAttune RP implants #6NL Femur, #6Tibia, 5 mm Attune RP bearing, 35 Patella     SURGEON: Nestor Lewandowsky    ASSISTANT:   Tomi Likens. [employer]   (Present and scrubbed throughout the case, critical for assistance with exposure, retraction, instrumentation, and closure.)         ANESTHESIA: Spinal, 20cc Exparel, 50cc 0.25% Marcaine  EBL: 350 cc  FLUID REPLACEMENT: 1500 cc crystaloid  Tourniquet Time: None  Drains: None  Tranexamic Acid: 1gm IV, 2gm topical  Exparel: 266mg    COMPLICATIONS:  None         INDICATIONS FOR PROCEDURE: The patient has  LEFT KNEE OSTEOARTHRITIS, RETAINED TIBIA PLATE, Val deformities, XR shows bone on bone arthritis, lateral subluxation of tibia. Patient has failed all conservative measures including anti-inflammatory medicines, narcotics, attempts at exercise and weight loss, cortisone injections and viscosupplementation.  Risks and benefits of surgery have been discussed, questions answered.   DESCRIPTION OF PROCEDURE: The patient identified by armband, received  IV antibiotics, in the holding area at [facility]. Patient taken to the operating room, appropriate anesthetic monitors were attached, and Spinal anesthesia was  induced. IV Tranexamic acid was given. Lateral post and foot positioner applied  to the table, the lower extremity was then prepped and draped in usual sterile fashion from the toes to the mid thigh Time-out procedure was performed. The skin and subcutaneous tissue along the incision was injected with 20 cc of a mixture of Exparel and Marcaine solution, using a 20-gauge by 1-1/2 inch needle. We began the operation, with the knee flexed 130 degrees, by making the anterior midline incision starting at handbreadth above the patella going over the patella 1 cm medial to and 4 cm distal to the tibial tubercle. Small bleeders in the skin and the subcutaneous tissue identified and cauterized. Transverse retinaculum was incised and reflected medially and a medial parapatellar arthrotomy was accomplished. the patella was everted and theprepatellar fat pad resected. The superficial medial collateral ligament was then elevated from anterior to posterior along the proximal flare of the tibia and anterior half of the menisci  resected. The knee was hyperflexed exposing bone on bone arthritis. Peripheral and notch osteophytes as well as the cruciate ligaments were then resected. We continued to work our way around posteriorly along the proximal tibia, and externally rotated the tibia subluxing it out from underneath the femur. A McHale retractor was placed through the notch and a lateral Hohmann retractor placed, and we then drilled through the proximal tibia in line with the axis of the tibia followed by an intramedullary guide rod and 2-degree posterior slope cutting guide. The tibial cutting guide, 4 degree posterior sloped, was pinned into place allowing resection of 7 mm of bone medially and 0 mm of bone laterally. Satisfied with the tibial resection, we then entered the distal femur 2 mm anterior to the PCL origin with the intramedullary guide rod and applied the distal femoral cutting guide set at 9 mm, with 5 degrees of valgus. This was pinned along the epicondylar axis. At this point, the distal  femoral cut was accomplished without difficulty. We then sized for a #6NL femoral component and pinned the guide in 0 degrees of external rotation. The chamfer cutting guide was pinned into place. The anterior, posterior, and chamfer cuts were accomplished without difficulty followed by the Attune RP box cutting guide and the box cut. We also removed posterior osteophytes from the posterior femoral condyles. The posterior capsule was injected with Exparel solution. The knee was brought into full extension. We checked our extension gap and fit a 5 mm bearing. Distracting in extension with a lamina spreader,  bleeders in the posterior capsule, Posterior medial and posterior lateral right down and cauterized.  The transexamic acid-soaked sponge was then placed in the gap of the knee and extension. The knee was flexed 30. The posterior patella cut was accomplished with the 9.5 mm Attune cutting guide, sized for a 35mm dome, and the fixation pegs drilled.The knee was then once again hyperflexed exposing the proximal tibia. We sized for a # 6 tibial base plate, applied the smokestack and the conical reamer followed by the the Delta fin keel punch. We then hammered into place the Attune RP trial femoral component, drilled the lugs, inserted a  5 mm trial bearing, trial patellar button, and took the knee through range of motion from 0-130 degrees. Medial and lateral ligamentous stability was checked. No thumb pressure was required for patellar Tracking. The tourniquet was not used. All trial components were removed, mating surfaces irrigated with pulse lavage, and dried with suction and sponges. 10 cc of the Exparel solution was applied to the cancellus bone of the patella distal femur and proximal tibia.  After waiting 30 seconds, the bony surfaces were again, dried with sponges. A double batch of DePuy HV cement was mixed and applied to all bony metallic mating surfaces except for the posterior condyles of the femur  itself. In order, we hammered into place the tibial tray and removed excess cement, the femoral component and removed excess cement. The final Attune RP bearing was inserted, and the knee brought to full extension with compression. The patellar button was clamped into place, and excess cement removed. The knee was held at 30 flexion with compression, while the cement cured. The wound was irrigated out with normal saline solution pulse lavage. The rest of the Exparel was injected into the parapatellar arthrotomy, subcutaneous tissues, and periosteal tissues. The parapatellar arthrotomy was closed with running #1 Vicryl suture. The subcutaneous tissue with 0 and 2-0 undyed Vicryl suture, and the skin with  running 3-0 SQ vicryl. An Aquacil and Ace wrap were applied. The patient was taken to recovery room without difficulty.   Nestor LewandowskyFrank J Cathe Bilger 12/22/2017, 10:21 AM

## 2017-12-22 NOTE — Evaluation (Signed)
Physical Therapy Evaluation Patient Details Name: Amber Haley MRN: [id number] DOB: 1965-06-01 Today's Date: 12/22/2017   History of Present Illness  52 yo female s/p L TKR with hardware removal. Pt with closed fracture of L tibial plateau with ORIF in 04/2017 due to work-related trauma to LLE. PMH includes smoking, anxiety.    Clinical Impression   Pt presents with L knee pain, decreased L knee control noted in L knee buckling upon standing, decreased L knee ROM, and difficulty performing bed mobility/transfers. Pt to benefit from acute PT to address deficits. Pt unable to ambulate this session due to L knee buckling. Pt corrected severity of buckling by decreasing LLE WB and PT assisted in correcting by providing L knee extension facilitation to enable pt to stand-pivot to recliner. RN notified of buckling, RN to put in order for knee immobilizer. Pt educated on quad sets (5-10/hour), ankle pumps (20/hour), and heel slides (5-10/hour) to perform this afternoon/evening to lessen stiffness and increase circulation, to pt's tolerance and limited by pain. PT to progress mobility as tolerated, and will continue to follow acutely.      Follow Up Recommendations Follow surgeon's recommendation for DC plan and follow-up therapies;Supervision for mobility/OOB(HHPT )    Equipment Recommendations  None recommended by PT    Recommendations for Other Services       Precautions / Restrictions Precautions Precautions: Fall Restrictions Weight Bearing Restrictions: No Other Position/Activity Restrictions: WBAT       Mobility  Bed Mobility Overal bed mobility: Needs Assistance Bed Mobility: Supine to Sit     Supine to sit: Min guard;HOB elevated     General bed mobility comments: Min guard for safety, verbal cuing for sequencing to EOB.   Transfers Overall transfer level: Needs assistance Equipment used: Rolling walker (2 wheeled) Transfers: Sit to/from [employer] Sit to  Stand: Min assist;From elevated surface Stand pivot transfers: Min assist       General transfer comment: Min assist for steadying upon standing, stabilizing LLE. Pt with with L knee buckling with WB. Min assist for stand pivot to recliner for knee extension assist, pt using UEs to offweight LLE during transfer.  Ambulation/Gait Ambulation/Gait assistance: (NT - pt with L knee buckling, no immobilizer order, RN notified)              Stairs            Wheelchair Mobility    Modified Rankin (Stroke Patients Only)       Balance Overall balance assessment: Mild deficits observed, not formally tested                                           Pertinent Vitals/Pain Pain Assessment: 0-10 Pain Score: 6  Pain Location: L knee  Pain Descriptors / Indicators: Throbbing Pain Intervention(s): Limited activity within patient's tolerance;Repositioned;Ice applied;Monitored during session;Premedicated before session    Home Living Family/patient expects to be discharged to:: Private residence Living Arrangements: Spouse/significant other Available Help at Discharge: Family;Available PRN/intermittently(Pt wants an aide because husband works ) Type of Home: House Home Access: Stairs to enter Entrance Stairs-Rails: None Secretary/administrator of Steps: 2 Home Layout: One level Home Equipment: Walker - 2 wheels;Hand held shower head;Shower seat;Cane - single point      Prior Function Level of Independence: Independent         Comments: pt reports going back to  driving, but hasn't worked since original injury in April 2019.      Hand Dominance   Dominant Hand: Right    Extremity/Trunk Assessment   Upper Extremity Assessment Upper Extremity Assessment: Overall WFL for tasks assessed    Lower Extremity Assessment Lower Extremity Assessment: Overall WFL for tasks assessed;LLE deficits/detail LLE Deficits / Details: suspected post-surgical weakness;  able to perform ankle pumps, quad sets, heel slides to 90*. Pt unable to perform SLR without assist, significant quad lag noted when attempting to perform SLR independently.  LLE Sensation: WNL    Cervical / Trunk Assessment Cervical / Trunk Assessment: Normal  Communication   Communication: No difficulties  Cognition Arousal/Alertness: Awake/alert Behavior During Therapy: WFL for tasks assessed/performed Overall Cognitive Status: Within Functional Limits for tasks assessed                                        General Comments      Exercises Total Joint Exercises Goniometric ROM: knee aarom ~5-90*, limited by pain    Assessment/Plan    PT Assessment Patient needs continued PT services  PT Problem List Decreased strength;Pain;Decreased range of motion;Decreased activity tolerance;Decreased knowledge of use of DME;Decreased balance;Decreased safety awareness;Decreased mobility       PT Treatment Interventions DME instruction;Therapeutic activities;Gait training;Therapeutic exercise;Patient/family education;Stair training;Balance training;Functional mobility training    PT Goals (Current goals can be found in the Care Plan section)  Acute Rehab PT Goals Patient Stated Goal: none stated  PT Goal Formulation: With patient Time For Goal Achievement: 01/05/18 Potential to Achieve Goals: Good    Frequency 7X/week   Barriers to discharge        Co-evaluation               AM-PAC PT "6 Clicks" Mobility  Outcome Measure Help needed turning from your back to your side while in a flat bed without using bedrails?: A Little Help needed moving from lying on your back to sitting on the side of a flat bed without using bedrails?: A Little Help needed moving to and from a bed to a chair (including a wheelchair)?: A Little Help needed standing up from a chair using your arms (e.g., wheelchair or bedside chair)?: A Little Help needed to walk in hospital room?: A  Little Help needed climbing 3-5 steps with a railing? : A Little 6 Click Score: 18    End of Session Equipment Utilized During Treatment: Gait belt Activity Tolerance: Patient tolerated treatment well;Other (comment)(limited by L knee buckling ) Patient left: in chair;with chair alarm set;with call bell/phone within reach;with family/visitor present;with SCD's reapplied Nurse Communication: Mobility status PT Visit Diagnosis: Other abnormalities of gait and mobility (R26.89);Difficulty in walking, not elsewhere classified (R26.2)    Time: [id number] PT Time Calculation (min) (ACUTE ONLY): 25 min   Charges:   PT Evaluation $PT Eval Low Complexity: 1 Low PT Treatments $Therapeutic Activity: 8-22 mins        Trevino Wyatt Terrial Rhodes Kamarii Buren, PT Acute Rehabilitation Services Pager [phone]  Office [phone]   Kauan Kloosterman D Carlyle Achenbach 12/22/2017, 5:14 PM

## 2017-12-22 NOTE — Anesthesia Preprocedure Evaluation (Addendum)
Anesthesia Evaluation  Patient identified by MRN, date of birth, ID band Patient awake    Reviewed: Allergy & Precautions, NPO status , Patient's Chart, lab work & pertinent test results  Airway Mallampati: II  TM Distance: >3 FB Neck ROM: Full    Dental  (+) Dental Advisory Given, Partial Upper   Pulmonary Current Smoker,    Pulmonary exam normal breath sounds clear to auscultation       Cardiovascular negative cardio ROS Normal cardiovascular exam Rhythm:Regular Rate:Normal     Neuro/Psych  Headaches, PSYCHIATRIC DISORDERS Anxiety    GI/Hepatic Neg liver ROS, GERD  Medicated,  Endo/Other  negative endocrine ROS  Renal/GU negative Renal ROS     Musculoskeletal  (+) Arthritis , LEFT KNEE OSTEOARTHRITIS, RETAINED TIBIA PLATE   Abdominal   Peds  Hematology negative hematology ROS (+) Plt 335k on 12/15/17   Anesthesia Other Findings Day of surgery medications reviewed with the patient.  Reproductive/Obstetrics                            Anesthesia Physical Anesthesia Plan  ASA: II  Anesthesia Plan: Spinal   Post-op Pain Management:  Regional for Post-op pain   Induction:   PONV Risk Score and Plan: 1 and Propofol infusion, Treatment may vary due to age or medical condition and Midazolam  Airway Management Planned: Natural Airway and Nasal Cannula  Additional Equipment:   Intra-op Plan:   Post-operative Plan:   Informed Consent: I have reviewed the patients History and Physical, chart, labs and discussed the procedure including the risks, benefits and alternatives for the proposed anesthesia with the patient or authorized representative who has indicated his/her understanding and acceptance.   Dental advisory given  Plan Discussed with: CRNA, Anesthesiologist and Surgeon  Anesthesia Plan Comments:         Anesthesia Quick Evaluation

## 2017-12-22 NOTE — Discharge Instructions (Signed)

## 2017-12-22 NOTE — Transfer of Care (Signed)
Immediate Anesthesia Transfer of Care Note  Patient: Amber Haley  Procedure(s) Performed: TOTAL KNEE ARTHROPLASTY WITH HARDWARE REMOVAL (Left Knee)  Patient Location: PACU  Anesthesia Type:Spinal and MAC combined with regional for post-op pain  Level of Consciousness: awake, alert , oriented and patient cooperative  Airway & Oxygen Therapy: Patient Spontanous Breathing and Patient connected to face mask oxygen  Post-op Assessment: Report given to RN and Post -op Vital signs reviewed and stable  Post vital signs: Reviewed and stable  Last Vitals:  Vitals Value Taken Time  BP    Temp    Pulse 77 12/22/2017 10:57 AM  Resp 14 12/22/2017 10:57 AM  SpO2 98 % 12/22/2017 10:57 AM  Vitals shown include unvalidated device data.  Last Pain:  Vitals:   12/22/17 0817  TempSrc:   PainSc: 0-No pain      Patients Stated Pain Goal: 4 (23/76/28 3151)  Complications: No apparent anesthesia complications

## 2017-12-23 ENCOUNTER — Encounter (HOSPITAL_COMMUNITY): Payer: Self-pay | Admitting: Orthopedic Surgery

## 2017-12-23 DIAGNOSIS — M1712 Unilateral primary osteoarthritis, left knee: Secondary | ICD-10-CM | POA: Diagnosis not present

## 2017-12-23 LAB — BASIC METABOLIC PANEL
Anion gap: 4 — ABNORMAL LOW (ref 5–15)
BUN: 12 mg/dL (ref 6–20)
CO2: 26 mmol/L (ref 22–32)
Calcium: 8.3 mg/dL — ABNORMAL LOW (ref 8.9–10.3)
Chloride: 107 mmol/L (ref 98–111)
Creatinine, Ser: 0.54 mg/dL (ref 0.44–1.00)
GFR calc Af Amer: >60 mL/min (ref >60)
GFR calc non Af Amer: >60 mL/min (ref >60)
Glucose, Bld: 131 mg/dL — ABNORMAL HIGH (ref 70–99)
POTASSIUM: 4.1 mmol/L (ref 3.5–5.1)
Sodium: 137 mmol/L (ref 135–145)

## 2017-12-23 LAB — CBC
HCT: 29.5 % — ABNORMAL LOW (ref 36.0–46.0)
Hemoglobin: 9.2 g/dL — ABNORMAL LOW (ref 12.0–15.0)
MCH: 31.2 pg (ref 26.0–34.0)
MCHC: 31.2 g/dL (ref 30.0–36.0)
MCV: 100 fL (ref 80.0–100.0)
PLATELETS: 240 10*3/uL (ref 150–400)
RBC: 2.95 MIL/uL — ABNORMAL LOW (ref 3.87–5.11)
RDW: 12.3 % (ref 11.5–15.5)
WBC: 9.9 10*3/uL (ref 4.0–10.5)
nRBC: 0 % (ref 0.0–0.2)

## 2017-12-23 NOTE — Anesthesia Postprocedure Evaluation (Signed)
Anesthesia Post Note  Patient: Amber Haley  Procedure(s) Performed: TOTAL KNEE ARTHROPLASTY WITH HARDWARE REMOVAL (Left Knee)     Patient location during evaluation: PACU Anesthesia Type: Spinal Level of consciousness: oriented, awake and alert and awake Pain management: pain level controlled Vital Signs Assessment: post-procedure vital signs reviewed and stable Respiratory status: spontaneous breathing, respiratory function stable and nonlabored ventilation Cardiovascular status: blood pressure returned to baseline and stable Postop Assessment: no headache, no backache, no apparent nausea or vomiting, patient able to bend at knees and spinal receding Anesthetic complications: no    Last Vitals:  Vitals:   12/23/17 0544 12/23/17 0704  BP: (!) 70/49 112/64  Pulse: 62 76  Resp: (!) 8 14  Temp: 36.7 C   SpO2: 96% 96%    Last Pain:  Vitals:   12/23/17 0544  TempSrc: Oral  PainSc:                  Cecile HearingStephen Edward Ramone Gander

## 2017-12-23 NOTE — Progress Notes (Signed)
PATIENT ID: Amber FuLisa A Haley  MRN: [id number]  DOB/AGE:  54/09/1965 / 54 y.o.  1 Day Post-Op Procedure(s) (LRB): TOTAL KNEE ARTHROPLASTY WITH HARDWARE REMOVAL (Left)    PROGRESS NOTE Subjective: Patient is alert, oriented, no Nausea, no Vomiting, yes passing gas. Taking PO well. Denies SOB, Chest or Calf Pain. Using Incentive Spirometer, PAS in place. Ambulate Bed to chair as the spinal was slow to wear off yesterday, Patient reports pain as 4/10 .    Objective: Vital signs in last 24 hours: Vitals:   12/23/17 0300 12/23/17 0521 12/23/17 0544 12/23/17 0704  BP: 98/64 95/61 (Abnormal) 70/49 112/64  Pulse: 68 63 62 76  Resp:  14 (Abnormal) 8 14  Temp:   98 F (36.7 C)   TempSrc:   Oral   SpO2:  93% 96% 96%  Weight:      Height:          Intake/Output from previous day: I/O last 3 completed shifts: In: 5149.3 [P.O.:1080; I.V.:3728; IV Piggyback:341.3] Out: 3300 [Urine:3200; Blood:100]   Intake/Output this shift: No intake/output data recorded.   LABORATORY DATA: Recent Labs    12/23/17 0417  WBC 9.9  HGB 9.2*  HCT 29.5*  PLT 240  NA 137  K 4.1  CL 107  CO2 26  BUN 12  CREATININE 0.54  GLUCOSE 131*  CALCIUM 8.3*    Examination: Neurologically intact ABD soft Neurovascular intact Sensation intact distally Intact pulses distally Dorsiflexion/Plantar flexion intact Incision: moderate drainage No cellulitis present Compartment soft} 80% of the Aquacel dressing had drainage it was changed this morning no active bleeding noted. Assessment:   1 Day Post-Op Procedure(s) (LRB): TOTAL KNEE ARTHROPLASTY WITH HARDWARE REMOVAL (Left) ADDITIONAL DIAGNOSIS: Expected Acute Blood Loss Anemia, Nicotine dependence  Patient's anticipated LOS is less than 2 midnights, meeting these requirements: - Younger than 5565 - Lives within 1 hour of care - Has a competent adult at home to recover with post-op recover - NO history of  - Chronic pain requiring opiods  - Diabetes  -  Coronary Artery Disease  - Heart failure  - Heart attack  - Stroke  - DVT/VTE  - Cardiac arrhythmia  - Respiratory Failure/COPD  - Renal failure  - Anemia  - Advanced Liver disease       Plan: PT/OT WBAT, AROM and PROM  DVT Prophylaxis:  SCDx72hrs, ASA 81 mg BID x 2 weeks DISCHARGE PLAN: Home DISCHARGE NEEDS: HHPT, Walker and 3-in-1 comode seat     Nestor LewandowskyFrank J Isebella Upshur 12/23/2017, 7:50 AM Patient ID: Amber FuLisa A Swanger, female   DOB: 07/15/1965, 52 y.o.   MRN: [id number]

## 2017-12-23 NOTE — Progress Notes (Signed)
Pt Physical Therapy Treatment Patient Details Name: Amber FuLisa A Talamante MRN: [id number] DOB: 09/01/1965 Today's Date: 12/23/2017    History of Present Illness 52 yo female s/p L TKR with hardware removal. Pt with closed fracture of L tibial plateau with ORIF in 04/2017 due to work-related trauma to LLE. PMH includes smoking, anxiety.      PT Comments    POD # 1 am session Spouse present for education on safe handling.  Assisted with amb in hallway, practiced stairs then returned to room to perform TKR TE's following HEP handout.  Instructed on proper tech and freq as use of ICE.   Follow Up Recommendations  Follow surgeon's recommendation for DC plan and follow-up therapies;Supervision for mobility/OOB(HH PT)     Equipment Recommendations  None recommended by PT    Recommendations for Other Services       Precautions / Restrictions      Mobility  Bed Mobility               General bed mobility comments: OOB in recliner   Transfers Overall transfer level: Needs assistance Equipment used: Rolling walker (2 wheeled) Transfers: Sit to/from Stand;Stand Pivot Transfers Sit to Stand: Supervision Stand pivot transfers: Supervision       General transfer comment: 25% VC's on safety with turns as pt is a quick mover and imnpulsive  Ambulation/Gait Ambulation/Gait assistance: Supervision;Min guard Gait Distance (Feet): 55 Feet Assistive device: Rolling walker (2 wheeled) Gait Pattern/deviations: Step-to pattern;Decreased stance time - left Gait velocity: too quick, VC's to decrease gait speed to increase safety.   General Gait Details: with spouse present for "hands on" instruction for safe handling    Stairs Stairs: Yes Stairs assistance: Min guard;Min assist Stair Management: No rails;Step to pattern;Backwards;Forwards Number of Stairs: 4 General stair comments: with spouse present "hands on" assist up 2 steps forward L knee buckled.  Second time up 2 steps backward  with walker (better)    Wheelchair Mobility    Modified Rankin (Stroke Patients Only)       Balance                                            Cognition Arousal/Alertness: Awake/alert Behavior During Therapy: WFL for tasks assessed/performed Overall Cognitive Status: Within Functional Limits for tasks assessed                                        Exercises   Total Knee Replacement TE's 10 reps B LE ankle pumps 10 reps towel squeezes 10 reps knee presses 10 reps heel slides  10 reps SAQ's 10 reps SLR's 10 reps ABD Followed by ICE     General Comments        Pertinent Vitals/Pain Pain Assessment: 0-10 Pain Score: 4  Pain Location: L knee  Pain Descriptors / Indicators: Aching;Operative site guarding;Grimacing Pain Intervention(s): Monitored during session;Premedicated before session;Repositioned;Ice applied    Home Living                      Prior Function            PT Goals (current goals can now be found in the care plan section)      Frequency    7X/week  PT Plan Current plan remains appropriate    Co-evaluation              AM-PAC PT "6 Clicks" Mobility   Outcome Measure  Help needed turning from your back to your side while in a flat bed without using bedrails?: A Little Help needed moving from lying on your back to sitting on the side of a flat bed without using bedrails?: A Little Help needed moving to and from a bed to a chair (including a wheelchair)?: A Little Help needed standing up from a chair using your arms (e.g., wheelchair or bedside chair)?: Total Help needed to walk in hospital room?: A Little Help needed climbing 3-5 steps with a railing? : A Little 6 Click Score: 16    End of Session Equipment Utilized During Treatment: Gait belt Activity Tolerance: Patient tolerated treatment well;Other (comment) Patient left: in chair;with chair alarm set;with call bell/phone  within reach;with family/visitor present;with SCD's reapplied Nurse Communication: (pt ready for D/C to home) PT Visit Diagnosis: Other abnormalities of gait and mobility (R26.89);Difficulty in walking, not elsewhere classified (R26.2)     Time: 0930-1000 PT Time Calculation (min) (ACUTE ONLY): 30 min  Charges:  $Gait Training: 8-22 mins $Therapeutic Exercise: 8-22 mins                     {Talah Cookston  PTA Acute  Rehabilitation Services Pager      [phone] Office      [phone]

## 2017-12-23 NOTE — Progress Notes (Signed)
[facility], [facility] to [facility]. Called [facility] to make them aware as well.

## 2017-12-23 NOTE — Care Management Note (Signed)
Case Management Note  Patient Details  Name: Amber Haley MRN: [id number] Date of Birth: 06/19/1965  Subjective/Objective:     Spoke with patient and spouse at bedside. [facility] Contacted Cammi Genuine Partsnderson, adjuster, she confirmed this is the correct agency.              Action/Plan: Contacted [facility] and they were aware of her pending needs and agree to accept the referral. Patient requested a different therapist, states the last one was "creepy". Made [facility] aware.   Expected Discharge Date:  12/23/17               Expected Discharge Plan:  Home w Home Health Services  In-House Referral:  NA  Discharge planning Services  CM Consult  Post Acute Care Choice:  Home Health Choice offered to:  Patient, Spouse  DME Arranged:  N/A DME Agency:  NA  HH Arranged:  PT HH Agency:  [facility]  Status of Service:  Completed, signed off  If discussed at Long Length of Stay Meetings, dates discussed:    Additional Comments:  Amber Haley, Amber Donaghy K, RN 12/23/2017, 9:50 AM

## 2017-12-23 NOTE — Discharge Summary (Signed)
Patient ID: Amber Haley MRN: [id number] DOB/AGE: [birth year] 52 y.o.  Admit date: 12/22/2017 Discharge date: 12/23/2017  Admission Diagnoses:  Principal Problem:   Degenerative arthritis of left knee Active Problems:   Arthritis of left knee   Discharge Diagnoses:  Same  Past Medical History:  Diagnosis Date  . Anxiety   . GERD (gastroesophageal reflux disease)    occasional  . Headache    hx of migraines  . Nicotine dependence 05/05/2017  . Tibial plateau fracture, left 05/01/2017   severely comminuted left tibial plateau fracture occurring today when a stack of cases of orange juice fell onto her leg./notes 05/02/2017    Surgeries: Procedure(s): TOTAL KNEE ARTHROPLASTY WITH HARDWARE REMOVAL on 12/22/2017   Consultants:   Discharged Condition: Improved  Hospital Course: Amber Haley is an 52 y.o. female who was admitted 12/22/2017 for operative treatment ofDegenerative arthritis of left knee. Patient has severe unremitting pain that affects sleep, daily activities, and work/hobbies. After pre-op clearance the patient was taken to the operating room on 12/22/2017 and underwent  Procedure(s): TOTAL KNEE ARTHROPLASTY WITH HARDWARE REMOVAL.    Patient was given perioperative antibiotics:  Anti-infectives (From admission, onward)   Start     Dose/Rate Route Frequency Ordered Stop   12/22/17 0645  ceFAZolin (ANCEF) IVPB 2g/100 mL premix     2 g 200 mL/hr over 30 Minutes Intravenous On call to O.R. 12/22/17 4098 12/22/17 0847       Patient was given sequential compression devices, early ambulation, and chemoprophylaxis to prevent DVT.  Patient benefited maximally from hospital stay and there were no complications.    Recent vital signs:  Patient Vitals for the past 24 hrs:  BP Temp Temp src Pulse Resp SpO2  12/23/17 0704 112/64 no documentation no documentation 76 14 96 %  12/23/17 0544 (Abnormal) 70/49 98 F (36.7 C) Oral 62 (Abnormal) 8 96 %  12/23/17 0521 95/61 no  documentation no documentation 63 14 93 %  12/23/17 0300 98/64 no documentation no documentation 68 no documentation no documentation  12/23/17 0153 (Abnormal) 94/47 97.8 F (36.6 C) Oral 72 12 94 %  12/23/17 0051 (Abnormal) 97/57 97.6 F (36.4 C) Oral 76 14 97 %  12/22/17 2145 (Abnormal) 103/55 no documentation no documentation 81 10 96 %  12/22/17 2142 no documentation 97.9 F (36.6 C) Oral no documentation no documentation no documentation  12/22/17 1840 (Abnormal) 121/57 98.1 F (36.7 C) Oral 69 16 95 %  12/22/17 1546 (Abnormal) 119/58 98.1 F (36.7 C) no documentation 76 16 96 %  12/22/17 1427 107/70 97.8 F (36.6 C) no documentation 74 14 93 %  12/22/17 1327 117/60 no documentation no documentation 77 14 96 %  12/22/17 1219 (Abnormal) 111/42 98.3 F (36.8 C) Oral 79 17 97 %  12/22/17 1145 no documentation 98 F (36.7 C) no documentation 92 (Abnormal) 22 96 %  12/22/17 1130 (Abnormal) 114/54 98 F (36.7 C) no documentation 79 16 97 %  12/22/17 1115 (Abnormal) 107/49 no documentation no documentation 88 (Abnormal) 22 98 %  12/22/17 1100 104/61 no documentation no documentation 80 19 99 %  12/22/17 1056 (Abnormal) 89/70 98 F (36.7 C) no documentation 80 15 99 %  12/22/17 0821 no documentation no documentation no documentation 70 12 98 %  12/22/17 0820 no documentation no documentation no documentation no documentation 19 no documentation  12/22/17 0819 no documentation no documentation no documentation 75 (Abnormal) 6 98 %  12/22/17 0818 no documentation no documentation no documentation 69  12 97 %  12/22/17 0817 (Abnormal) 104/57 no documentation no documentation 68 11 97 %  12/22/17 0816 no documentation no documentation no documentation 72 12 97 %  12/22/17 0815 no documentation no documentation no documentation 72 12 95 %  12/22/17 0814 no documentation no documentation no documentation 71 12 96 %  12/22/17 0813 no documentation no documentation no documentation 85  (Abnormal) 21 97 %  12/22/17 0812 118/71 no documentation no documentation 76 17 98 %  12/22/17 0811 no documentation no documentation no documentation no documentation 17 no documentation  12/22/17 0810 no documentation no documentation no documentation no documentation 15 no documentation  12/22/17 0809 no documentation no documentation no documentation no documentation 18 no documentation  12/22/17 0808 no documentation no documentation no documentation 74 20 98 %  12/22/17 0807 109/67 no documentation no documentation 78 (Abnormal) 21 99 %  12/22/17 0806 no documentation no documentation no documentation 74 15 97 %     Recent laboratory studies:  Recent Labs    12/23/17 0417  WBC 9.9  HGB 9.2*  HCT 29.5*  PLT 240  NA 137  K 4.1  CL 107  CO2 26  BUN 12  CREATININE 0.54  GLUCOSE 131*  CALCIUM 8.3*     Discharge Medications:   Allergies as of 12/23/2017   No Known Allergies     Medication List    Stop taking these medications   methocarbamol 750 MG tablet Commonly known as:  ROBAXIN     Take these medications   ALIGN 4 MG Caps Take 4 mg by mouth daily.   ALPRAZolam 0.25 MG tablet Commonly known as:  XANAX Take 0.25 mg by mouth daily as needed for anxiety.   aspirin EC 81 MG tablet Take 1 tablet (81 mg total) by mouth 2 (two) times daily.   omeprazole 20 MG tablet Commonly known as:  PRILOSEC OTC Take 20 mg by mouth daily as needed (heartburn).   ONE-A-DAY WOMENS PO Take 1 tablet by mouth daily.   oxyCODONE-acetaminophen 5-325 MG tablet Commonly known as:  PERCOCET/ROXICET Take 1 tablet by mouth every 4 (four) hours as needed for severe pain. What changed:  how much to take   sertraline 100 MG tablet Commonly known as:  ZOLOFT Take 100 mg by mouth daily.   tiZANidine 2 MG tablet Commonly known as:  ZANAFLEX Take 1 tablet (2 mg total) by mouth every 6 (six) hours as needed.   traZODone 50 MG tablet Commonly known as:  DESYREL Take 50 mg by  mouth at bedtime as needed for sleep.        Durable Medical Equipment  (From admission, onward)         Start     Ordered   12/22/17 1227  DME Walker rolling  Once    Question:  Patient needs a walker to treat with the following condition  Answer:  Status post total left knee replacement   12/22/17 1226   12/22/17 1227  DME 3 n 1  Once     12/22/17 1226           Discharge Care Instructions  (From admission, onward)         Start     Ordered   12/23/17 0000  Change dressing    Comments:  Change dressing Only if drainage exceeds 40% of window on dressing   12/23/17 0757          Diagnostic Studies: Dg Chest 2 View  Result Date: 12/15/2017 CLINICAL DATA:  Pre-op respiratory exam for left total knee replacement. EXAM: CHEST - 2 VIEW COMPARISON:  05/03/2017 FINDINGS: The heart size and mediastinal contours are within normal limits. Both lungs are clear. The visualized skeletal structures are unremarkable. IMPRESSION: No active cardiopulmonary disease. Electronically Signed   By: Myles Rosenthal M.D.   On: 12/15/2017 14:41    Disposition: Discharge disposition: 01-Home or Self Care       Discharge Instructions    Call MD / Call 911   Complete by:  As directed    If you experience chest pain or shortness of breath, CALL 911 and be transported to the hospital emergency room.  If you develope a fever above 101 F, pus (white drainage) or increased drainage or redness at the wound, or calf pain, call your surgeon's office.   Change dressing   Complete by:  As directed    Change dressing Only if drainage exceeds 40% of window on dressing   Constipation Prevention   Complete by:  As directed    Drink plenty of fluids.  Prune juice may be helpful.  You may use a stool softener, such as Colace (over the counter) 100 mg twice a day.  Use MiraLax (over the counter) for constipation as needed.   Diet - low sodium heart healthy   Complete by:  As directed    Increase activity  slowly as tolerated   Complete by:  As directed       Follow-up Information    Gean Birchwood, MD In 2 weeks.   Specialty:  Orthopedic Surgery Contact information: [address] [place] [address] [phone]            Signed: Nestor Lewandowsky 12/23/2017, 7:57 AM

## 2017-12-23 NOTE — Progress Notes (Signed)
Dressing reinforced with guaze and tape d/t dressing has mod collection of serosanguineous discharge on the dressing. Ace wrap, ice and knee immobilizer applied. Will continue to monitor  patient.

## 2020-05-29 ENCOUNTER — Other Ambulatory Visit: Payer: Self-pay | Admitting: Orthopedic Surgery

## 2020-05-29 ENCOUNTER — Other Ambulatory Visit (HOSPITAL_COMMUNITY): Payer: Self-pay | Admitting: Orthopedic Surgery

## 2020-05-29 DIAGNOSIS — M25562 Pain in left knee: Secondary | ICD-10-CM

## 2020-06-06 ENCOUNTER — Encounter (HOSPITAL_COMMUNITY)
Admission: RE | Admit: 2020-06-06 | Discharge: 2020-06-06 | Disposition: A | Discharge status: Home / Self Care | Payer: Worker's Compensation | Source: Ambulatory Visit | Attending: Orthopedic Surgery | Admitting: Orthopedic Surgery

## 2020-06-06 ENCOUNTER — Other Ambulatory Visit: Payer: Self-pay

## 2020-06-06 DIAGNOSIS — M25562 Pain in left knee: Secondary | ICD-10-CM | POA: Insufficient documentation

## 2020-06-06 MED ORDER — TECHNETIUM TC 99M MEDRONATE IV KIT
20.0000 | PACK | Freq: Once | INTRAVENOUS | Status: AC | PRN
  Administered 2020-06-06: 21.7 via INTRAVENOUS

## 2020-06-19 ENCOUNTER — Ambulatory Visit (HOSPITAL_COMMUNITY): Payer: Self-pay

## 2020-06-19 ENCOUNTER — Other Ambulatory Visit (HOSPITAL_COMMUNITY): Payer: Self-pay

## 2022-09-22 IMAGING — NM NM BONE 3 PHASE
10 series · 20 of 20 positions shown · non-contrast
Comparison: None

Radiographic correlation: 05/05/2017

CLINICAL DATA: LEFT knee pain since LEFT knee arthroplasty in 2719

EXAM:
NUCLEAR MEDICINE 3-PHASE BONE SCAN
TECHNIQUE: Radionuclide angiographic images, immediate static blood pool
images, and 3-hour delayed static images were obtained of the knees
after intravenous injection of radiopharmaceutical.
RADIOPHARMACEUTICALS:  21.7 mCi Wc-WWm MDP IV

[Series 1: flow · 2.07mm/px · 6 of 48 frames shown (1 of 2)]
[frame 5/48  full-range]
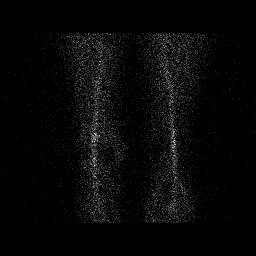
[frame 13/48  full-range]
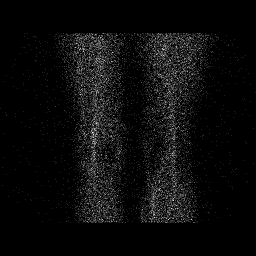
[frame 21/48  full-range]
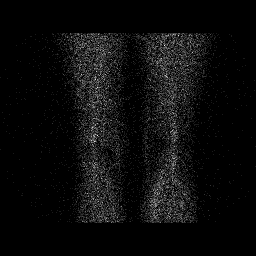
[frame 29/48  full-range]
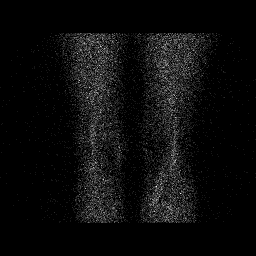
[frame 37/48  full-range]
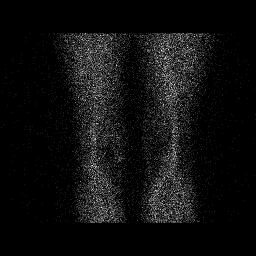
[frame 45/48  full-range]
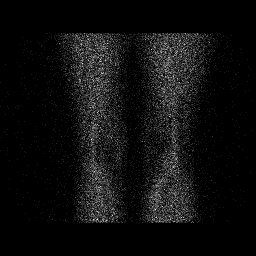

[Series 1: delay · delayed · 2.07mm/px · 1 of 1 slices shown (1 of 4)]
[im 1/1]
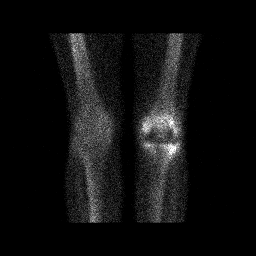

[Series 1: flow · 2.07mm/px · 6 of 48 frames shown (2 of 2)]
[frame 5/48  full-range]
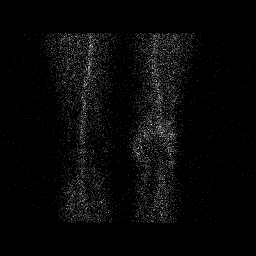
[frame 13/48  full-range]
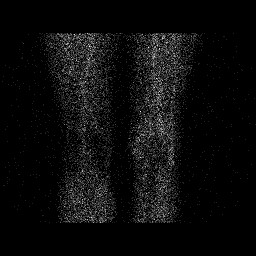
[frame 21/48  full-range]
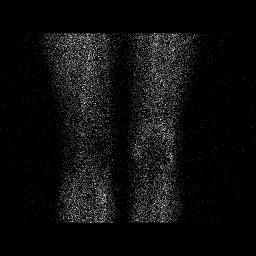
[frame 29/48  full-range]
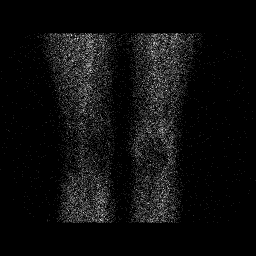
[frame 37/48  full-range]
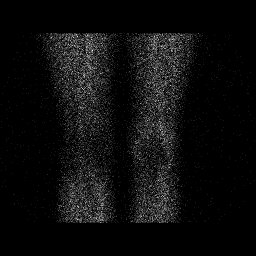
[frame 45/48  full-range]
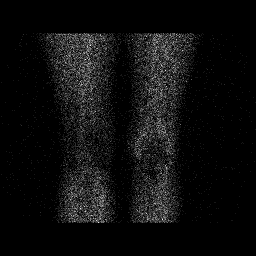

[Series 1: delay · delayed · 2.07mm/px · 1 of 1 slices shown (2 of 4)]
[im 1/1  full-range]
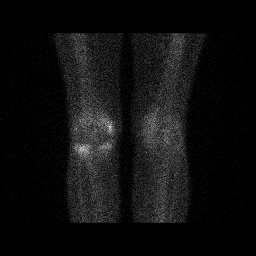

[Series 2: delay · delayed · 2.07mm/px · 1 of 1 slices shown (3 of 4)]
[im 1/1]
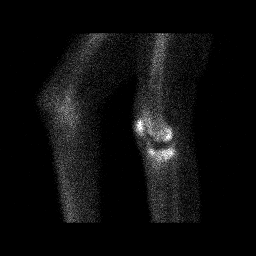

[Series 2: delay · delayed · 2.07mm/px · 1 of 1 slices shown (4 of 4)]
[im 1/1  full-range]
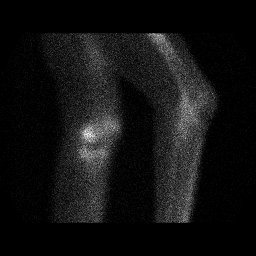

[Series 2: blood pool · 2.07mm/px · 1 of 1 slices shown (1 of 2)]
[im 1/1  full-range]
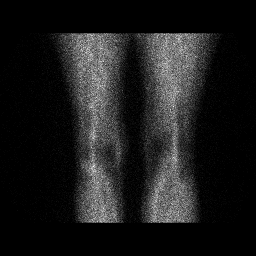

[Series 2: blood pool · 2.07mm/px · 1 of 1 slices shown (2 of 2)]
[im 1/1  full-range]
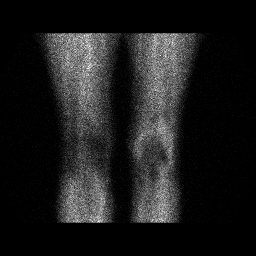

[Series 3: lat bp · 2.07mm/px · 1 of 1 slices shown (1 of 2)]
[im 1/1  full-range]
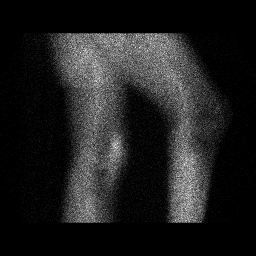

[Series 3: lat bp · 2.07mm/px · 1 of 1 slices shown (2 of 2)]
[im 1/1  full-range]
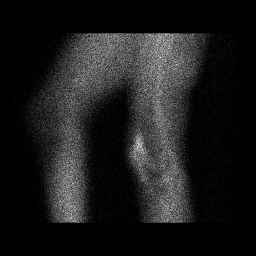

[20 of 20 positions shown; findings below may reference images not displayed]

FINDINGS: Vascular phase: Increased blood flow to the periarticular regions of
the LEFT knee anteriorly. Normal blood flow to RIGHT knee.

Blood pool phase: Increased blood pool anteriorly surrounding LEFT
knee. Normal blood pool at RIGHT knee.

Delayed phase: Normal tracer uptake at RIGHT knee. Photopenic defect
at LEFT knee from prosthesis. Focal abnormal increased osseous
tracer localization at the lateral tibial plateau adjacent to the
tibial component of the LEFT knee prosthesis concerning for aseptic
loosening of the prosthesis, infection not completely exclude. No
other significant focal abnormal tracer uptake is identified at the
LEFT knee.
IMPRESSION: Increased blood flow and blood pool at the periarticular regions of
LEFT knee consistent with hyperemia question synovitis.

Focal abnormal delayed tracer uptake at LEFT lateral tibial plateau
adjacent to the prosthesis concerning for aseptic loosening of the
prosthesis as discussed above.
